# Patient Record
Sex: Female | Born: 1955 | Race: Black or African American | Hispanic: No | Marital: Single | Smoking: Never smoker
Health system: Southern US, Community
[De-identification: ages and names within clinical notes are randomized; demographics above are authoritative.]

## PROBLEM LIST (undated history)

## (undated) DIAGNOSIS — N390 Urinary tract infection, site not specified: Secondary | ICD-10-CM

## (undated) HISTORY — PX: TONSILLECTOMY: SUR1361

## (undated) HISTORY — PX: ABDOMINAL HYSTERECTOMY: SHX81

---

## 2000-01-16 ENCOUNTER — Encounter: Payer: Self-pay | Admitting: Emergency Medicine

## 2000-01-16 ENCOUNTER — Emergency Department (HOSPITAL_COMMUNITY): Admission: EM | Admit: 2000-01-16 | Discharge: 2000-01-16 | Payer: Self-pay | Admitting: Emergency Medicine

## 2000-09-03 ENCOUNTER — Ambulatory Visit (HOSPITAL_COMMUNITY): Admission: RE | Admit: 2000-09-03 | Discharge: 2000-09-03 | Payer: Self-pay | Admitting: Family Medicine

## 2000-09-03 ENCOUNTER — Encounter: Payer: Self-pay | Admitting: Family Medicine

## 2000-10-17 ENCOUNTER — Encounter: Admission: RE | Admit: 2000-10-17 | Discharge: 2000-12-03 | Payer: Self-pay | Admitting: Family Medicine

## 2001-08-13 ENCOUNTER — Emergency Department (HOSPITAL_COMMUNITY): Admission: EM | Admit: 2001-08-13 | Discharge: 2001-08-13 | Payer: Self-pay | Admitting: Emergency Medicine

## 2002-02-26 ENCOUNTER — Emergency Department (HOSPITAL_COMMUNITY): Admission: EM | Admit: 2002-02-26 | Discharge: 2002-02-27 | Payer: Self-pay | Admitting: Emergency Medicine

## 2004-10-22 ENCOUNTER — Emergency Department (HOSPITAL_COMMUNITY): Admission: EM | Admit: 2004-10-22 | Discharge: 2004-10-23 | Payer: Self-pay | Admitting: Emergency Medicine

## 2004-10-26 ENCOUNTER — Emergency Department (HOSPITAL_COMMUNITY): Admission: EM | Admit: 2004-10-26 | Discharge: 2004-10-26 | Payer: Self-pay | Admitting: Emergency Medicine

## 2005-08-26 ENCOUNTER — Inpatient Hospital Stay (HOSPITAL_COMMUNITY): Admission: EM | Admit: 2005-08-26 | Discharge: 2005-09-02 | Payer: Self-pay | Admitting: Emergency Medicine

## 2005-08-26 ENCOUNTER — Ambulatory Visit: Payer: Self-pay | Admitting: Infectious Diseases

## 2007-12-19 ENCOUNTER — Emergency Department (HOSPITAL_COMMUNITY): Admission: EM | Admit: 2007-12-19 | Discharge: 2007-12-19 | Payer: Self-pay | Admitting: Family Medicine

## 2008-05-12 ENCOUNTER — Emergency Department (HOSPITAL_COMMUNITY): Admission: EM | Admit: 2008-05-12 | Discharge: 2008-05-12 | Payer: Self-pay | Admitting: Emergency Medicine

## 2009-08-03 ENCOUNTER — Emergency Department (HOSPITAL_COMMUNITY): Admission: EM | Admit: 2009-08-03 | Discharge: 2009-08-03 | Payer: Self-pay | Admitting: Family Medicine

## 2010-09-22 ENCOUNTER — Ambulatory Visit (INDEPENDENT_AMBULATORY_CARE_PROVIDER_SITE_OTHER): Payer: Self-pay

## 2010-09-22 ENCOUNTER — Inpatient Hospital Stay (INDEPENDENT_AMBULATORY_CARE_PROVIDER_SITE_OTHER)
Admission: RE | Admit: 2010-09-22 | Discharge: 2010-09-22 | Disposition: A | Discharge status: Home / Self Care | Payer: Self-pay | Source: Ambulatory Visit | Attending: Family Medicine | Admitting: Family Medicine

## 2010-09-22 ENCOUNTER — Encounter (HOSPITAL_COMMUNITY): Payer: Self-pay

## 2010-09-22 DIAGNOSIS — R1012 Left upper quadrant pain: Secondary | ICD-10-CM

## 2010-09-22 LAB — POCT URINALYSIS DIP (DEVICE)
Bilirubin Urine: NEGATIVE
Glucose, UA: NEGATIVE mg/dL
Hgb urine dipstick: NEGATIVE
Ketones, ur: NEGATIVE mg/dL
Nitrite: NEGATIVE
Protein, ur: NEGATIVE mg/dL
Specific Gravity, Urine: 1.02 (ref 1.005–1.030)
Urobilinogen, UA: 0.2 mg/dL (ref 0.0–1.0)
pH: 5.5 (ref 5.0–8.0)

## 2010-10-20 NOTE — H&P (Signed)
NAME:  [name], [name]                  ACCOUNT NO.:  [id number]   MEDICAL RECORD NO.:  [id number]          PATIENT TYPE:  EMS   LOCATION:  [facility]                         FACILITY:  [facility]   PHYSICIAN:  Lonia Blood, M.D.DATE OF BIRTH:  [birth year]   DATE OF PROCEDURE:  DATE OF DISCHARGE:                      STAT - MUST CHANGE TO CORRECT WORK TYPE   PRIMARY CARE PHYSICIAN:  Della Goo, M.D.   CHIEF COMPLAINT:  Headache for four days with nausea and vomiting.   HISTORY OF PRESENT ILLNESS:  [name] is a very pleasant 55 year old  female who recently moved here from [place].  On August 16, 2005, she began  to experience cough, generalized body aches and lethargy.  She presented to  Dr. Lovell Sheehan for evaluation as a new patient.  She was diagnosed with  probable influenza and given a prescription for Tamiflu.  She as also given  a prescription for Z pack.  Patient completed her full course of Tamiflu,  waited a couple of days and then initiated her Z pack.  She finished her Z  pack approximately 1-1/2 to 2 weeks ago.  Initially she felt better with  increased energy though she did continue to cough.  Four days ago, she again  began to experience chills.  This time, she developed a generalized full  head headache.  She attempted to treat this with Aleve without success.  Headache continued throughout Friday and Saturday.  By Saturday night, she  was feeling very tired.  She noticed that loud noises really bothered her.  Walking caused a sense of a jackhammer in my head.  Bright lights began to  hurt her significantly and she felt that her neck was very sore.  Early  Sunday morning, she presented to the emergency room for evaluation.   In the emergency room, LP has been accomplished which reveals decreased  glucose and elevated protein with elevated white count and lymphocyte  predominance.  Patient is resting comfortably.  She is alert and oriented  and able to answer all  questions.   MEDICATIONS:  No chronic prescription medication.   ALLERGIES:  PENICILLIN.   REVIEW OF SYSTEMS:  COMPREHENSIVE:  Negative.  Patient does report that she  is currently sexually active.   PAST MEDICAL HISTORY:  1.  Status post hysterectomy approximately 15 years ago.  2.  Tobacco abuse in the amount of less than a pack per day 16 years with an      episode of discontinuing for eight years, then restarting.   FAMILY HISTORY:  Patient's mother is alive and has hypertension.  Patient's  father is alive with diabetes and hypertension.  Patient has siblings who  are all healthy.   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  She occasionally partakes of alcohol.  She  has recently moved here from [place].   LABORATORY DATA:  Hemoglobin is normal.  White count and platelet count are  normal.  MCV is 92.  Electrolytes are balanced and normal.  BUN is 13,  creatinine  0.8.  Serum glucose is mildly elevated at 103.  Urinalysis  negative.  Monospot is negative.   CT scan of the head reveals no acute disease.   LP reveals 63 red blood cells, 600 white blood cells and 85% lymphocyte  predominance, glucose is low normal at 43, protein is elevated at 150.  Gram  stain shows no organisms at this time.   PHYSICAL EXAMINATION:  GENERAL APPEARANCE:  Well-developed, well-nourished  female in no acute respiratory distress.  VITAL SIGNS:  Temperature 108, blood pressure 102/67, heart rate 77,  respiratory rate 96% on room air.  HEENT:  Patient is wearing colored contacts.  Pupils equal and reactive.  Patient does display some photophobia.  NECK:  There is meningismus.  LUNGS:  Clear to auscultation bilaterally.  CARDIOVASCULAR:  Regular rate and rhythm.  ABDOMEN:  Benign.  EXTREMITIES:  No significant clubbing, cyanosis, or edema.  NEUROLOGIC:  Patient is alert and oriented x4.  There is no evidence of a  delirium.  She  has full control of all four extremities and preserved  strength.   IMPRESSION:  1.  Meningitis - Patient has a meningitis.  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  The patient's low glucose is      somewhat concerning to me, however.  Especially in the setting of a      recent antibiotic course which could cloud the picture with a bacterial      meningitis.  I would, however, at this time that the early lymphocyte      predominance that can even be seen with bacterial meningitis, would have      converted to a polymorphonuclear predominance at this point.  I will,      however, choose to cover the patient empirically with Rocephin while      cultures are allowed to mature.  As the patient is sexually active, I      will also check HSV PCR, the patient's CSF.  CSF VDRL as well as serum      RPR will also be obtained.  Human immunodeficiency virus testing will be      carried out.  2.  Tobacco abuse - Patient is being counseled on the need to discontinue      tobacco abuse and advise of the multiple deleterious effects of ongoing      tobacco abuse.  Tobacco cessation consultation will be requested during      this hospital stay to encourage the patient in this regard.      Lonia Blood, M.D.  Electronically Signed     JTM/MEDQ  D:  08/26/2005  T:  08/27/2005  Job:  [id number]   cc:   Della Goo, M.D.  Fax: [phone]

## 2010-10-20 NOTE — Discharge Summary (Signed)
Toni Rodriguez, Toni Rodriguez                  ACCOUNT NO.:  [id number]   MEDICAL RECORD NO.:  [id number]          PATIENT TYPE:  INP   LOCATION:  5740                         FACILITY:  [facility]   PHYSICIAN:  Hillery Aldo, M.D.   DATE OF BIRTH:  Aug 26, 1955   DATE OF ADMISSION:  08/26/2005  DATE OF DISCHARGE:  09/02/2005                                 DISCHARGE SUMMARY   PRIMARY CARE PHYSICIAN:  Della Goo, M.D.   DISCHARGE DIAGNOSES:  1.  Herpes meningitis.  2.  Headache secondary to #1.  3.  Nausea and vomiting secondary to #1.  4.  Tobacco abuse.   DISCHARGE MEDICATIONS:  1.  Valtrex 1 g t.i.d. x2 weeks.  2.  Percocet 5/325 mg one to two tablets q.6h. p.r.n. pain.  3.  Phenergan 25 mg q.6h. p.r.n. nausea.   CONSULTANTS:  Lacretia Leigh. Ninetta Lights, M.D., infectious disease.   BRIEF ADMISSION HISTORY OF PRESENT ILLNESS:  The patient is a 55 year old  female with no significant past medical history, who presented to Dr.  Lovell Sheehan with generalized body aches, lethargy and cough.  She was thought to  have the flu and given a prescription for Tamiflu along with a Z-Pak.  She  completed a full course of Tamiflu and her Z-Pak and initially felt better  but then developed chills.  This was accompanied by a global headache which  did not respond to Aleve.  She therefore presented to the emergency  department with persistent severe headache, photophobia and phonophobia.  An  LP was done in the emergency department which showed evidence of viral  meningitis, and therefore she was admitted for further evaluation and  workup.   PROCEDURES AND DIAGNOSTIC STUDIES:  1.  CT scan of the head on August 26, 2005 was negative.  2.  Lumbar puncture on August 26, 2005.   DISCHARGE LABORATORY VALUES:  White blood cell count was 3.6, hemoglobin  10.6, hematocrit 31 and platelets 148.  PCR of the spinal fluid was positive  for herpes simplex.  Cultures remained negative.  Sodium was 138, potassium  3.8,  chloride 108, bicarb 26, BUN 5, creatinine 0.8, glucose 114.   HOSPITAL COURSE BY PROBLEM:  Problem 1.  HERPES MENINGITIS:  The patient was admitted with a presumptive  diagnosis of viral meningitis.  A lumbar puncture was performed and cultures  and PCR for herpes simplex virus were sent off.  She was empirically treated  with Rocephin and acyclovir pending culture results.  When the cultures  remained negative for three days, the Rocephin was continued.  The herpes  simplex virus PCR was positive and she was continued on acyclovir for one  full week of IV treatment.  Infectious disease consultation was requested  and kindly provided by Dr. Ninetta Lights, who suggested two weeks of p.o. therapy  with Valtrex.  He also suggested we check her urine for GC and chlamydia  prior to discharge.   She was stable for discharge by September 02, 2005, and was discharged with a  follow-up Dr. Barbette Merino.  Dr. Lovell Sheehan will need to follow up on  her GC and  chlamydia testing, as this was done on the day of discharge.   Problem 2.  HEADACHE/NAUSEA AND VOMITING:  These were side effects of her  active meningitis.  He was treated with IV antiemetics and narcotic pain  medications to control her pain and nausea.  At the time of this dictation,  she is able to hold on p.o.'s we will switch her to by-mouth antibiotics and  narcotic pain medications.   Problem 3.  TOBACCO USE:  The patient was counseled on smoking cessation.   DISPOSITION:  The patient is discharged home.  She is to follow up with Dr.  Lovell Sheehan in one to two weeks' time.  No restrictions on her activities or  diet.  She should call her primary care physician for any nonresolution of  symptoms.           ______________________________  Hillery Aldo, M.D.     CR/MEDQ  D:  09/02/2005  T:  09/03/2005  Job:  [id number]   cc:   Della Goo, M.D.  Fax: [phone]

## 2011-08-02 ENCOUNTER — Emergency Department (HOSPITAL_COMMUNITY)
Admission: EM | Admit: 2011-08-02 | Discharge: 2011-08-02 | Disposition: A | Discharge status: Home / Self Care | Payer: Self-pay | Source: Home / Self Care | Attending: Family Medicine | Admitting: Family Medicine

## 2011-08-02 ENCOUNTER — Encounter (HOSPITAL_COMMUNITY): Payer: Self-pay | Admitting: Emergency Medicine

## 2011-08-02 DIAGNOSIS — M545 Low back pain, unspecified: Secondary | ICD-10-CM

## 2011-08-02 HISTORY — DX: Urinary tract infection, site not specified: N39.0

## 2011-08-02 LAB — POCT URINALYSIS DIP (DEVICE)
Leukocytes, UA: NEGATIVE
Nitrite: NEGATIVE
Protein, ur: NEGATIVE mg/dL
Urobilinogen, UA: 0.2 mg/dL (ref 0.0–1.0)
pH: 5.5 (ref 5.0–8.0)

## 2011-08-02 LAB — POCT PREGNANCY, URINE: Preg Test, Ur: NEGATIVE

## 2011-08-02 MED ORDER — IBUPROFEN 800 MG PO TABS: 800.0000 mg | ORAL_TABLET | Freq: Three times a day (TID) | ORAL | Status: AC

## 2011-08-02 MED ORDER — CYCLOBENZAPRINE HCL 10 MG PO TABS: 10.0000 mg | ORAL_TABLET | Freq: Three times a day (TID) | ORAL | Status: AC | PRN

## 2011-08-02 NOTE — ED Notes (Signed)
Patient reports urinary urgency and frequency, denies any burning with urination.  Patient used test from store that indicated a uti

## 2011-08-02 NOTE — ED Provider Notes (Signed)
History     CSN: [id number]  Arrival date & time 08/02/11  1758   First MD Initiated Contact with Patient 08/02/11 1808      Chief Complaint  Patient presents with  . Back Pain    (Consider location/radiation/quality/duration/timing/severity/associated sxs/prior treatment) HPI Comments: The patient reports having chronic intermittent low back pain. For the past wk she has had pain and urinary frequency. She is unsure if it is a uti or her back pain. No fever. Pain more on left. occ sharpe pain. Has frequency and urgency. No fever. No known injury. No change in bladder or bowel control  The history is provided by the patient.    Past Medical History  Diagnosis Date  . UTI (lower urinary tract infection)     Past Surgical History  Procedure Date  . Abdominal hysterectomy   . Tonsillectomy     History reviewed. No pertinent family history.  History  Substance Use Topics  . Smoking status: Never Smoker   . Smokeless tobacco: Not on file  . Alcohol Use: No    OB History    Grav Para Term Preterm Abortions TAB SAB Ect Mult Living                  Review of Systems  Constitutional: Negative.   HENT: Negative.   Respiratory: Negative.   Cardiovascular: Negative.   Gastrointestinal: Negative.     Allergies  Penicillins  Home Medications   Current Outpatient Rx  Name Route Sig Dispense Refill  . OVER THE COUNTER MEDICATION  Azo --took this medicine 3 days ago, took this med for 2 days at that time    . CYCLOBENZAPRINE HCL 10 MG PO TABS Oral Take 1 tablet (10 mg total) by mouth 3 (three) times daily as needed for muscle spasms. 15 tablet 0  . IBUPROFEN 800 MG PO TABS Oral Take 1 tablet (800 mg total) by mouth 3 (three) times daily. 21 tablet 0    BP 114/67  Pulse 79  Temp(Src) 98.7 F (37.1 C) (Oral)  Resp 18  SpO2 97%  Physical Exam  Nursing note and vitals reviewed. Constitutional: She appears well-developed and well-nourished. No distress.    Cardiovascular: Normal rate.   Pulmonary/Chest: Effort normal and breath sounds normal.  Abdominal: Soft. Bowel sounds are normal. There is no tenderness. There is no rebound and no guarding.  Musculoskeletal:       Pain left flank. Pain with flex and extension. Heel toe walk intact. Neg slr. , dtr symmetrical. No deformity. No skin changes    ED Course  Procedures (including critical care time)   Labs Reviewed  POCT URINALYSIS DIP (DEVICE)  POCT PREGNANCY, URINE   No results found.   1. Low back pain       MDM          Randa Spike, MD 08/02/11 2001

## 2011-08-02 NOTE — ED Notes (Signed)
Lower back and side pain.  Patient concerned for uti.  Onset one week ago of symptoms.

## 2012-09-16 ENCOUNTER — Emergency Department (INDEPENDENT_AMBULATORY_CARE_PROVIDER_SITE_OTHER)
Admission: EM | Admit: 2012-09-16 | Discharge: 2012-09-16 | Disposition: A | Discharge status: Home / Self Care | Payer: Self-pay | Source: Home / Self Care | Attending: Emergency Medicine | Admitting: Emergency Medicine

## 2012-09-16 ENCOUNTER — Encounter (HOSPITAL_COMMUNITY): Payer: Self-pay | Admitting: Emergency Medicine

## 2012-09-16 DIAGNOSIS — L509 Urticaria, unspecified: Secondary | ICD-10-CM

## 2012-09-16 DIAGNOSIS — N318 Other neuromuscular dysfunction of bladder: Secondary | ICD-10-CM

## 2012-09-16 DIAGNOSIS — N3281 Overactive bladder: Secondary | ICD-10-CM

## 2012-09-16 LAB — POCT URINALYSIS DIP (DEVICE)
Ketones, ur: NEGATIVE mg/dL
Leukocytes, UA: NEGATIVE
Protein, ur: NEGATIVE mg/dL
Urobilinogen, UA: 0.2 mg/dL (ref 0.0–1.0)
pH: 5.5 (ref 5.0–8.0)

## 2012-09-16 MED ORDER — HYDROXYZINE HCL 25 MG PO TABS: 25.0000 mg | ORAL_TABLET | Freq: Four times a day (QID) | ORAL | Status: DC

## 2012-09-16 MED ORDER — PREDNISONE 10 MG PO TABS: 40.0000 mg | ORAL_TABLET | Freq: Every day | ORAL | Status: AC

## 2012-09-16 NOTE — ED Provider Notes (Signed)
History     CSN: [id number]  Arrival date & time 09/16/12  4098   First MD Initiated Contact with Patient 09/16/12 1839      Chief Complaint  Patient presents with  . Urinary Tract Infection    hx of leaking bladder. urgency  . Rash    on arms and legs.. ?'s allergic reaction to crab meat or oxytrol patch    (Consider location/radiation/quality/duration/timing/severity/associated sxs/prior treatment) HPI Comments: Patient presents urgent care tonight describes a for the last couple weeks she suspects she's been having an allergic reaction to either some crab meat she ate or to the Oxytrol patch. She started developing this areas of redness and itchiness on her upper extremities near her neck area and on her lower extremities. They have " come and gone", and they have not responded to some of the medicines she was taking for allergies over-the-counter. Patient denies any facial swelling difficulty swallowing or problems breathing. She opted to discontinue her Oxytrol as she was expecting suspected that this might have been related to this medicine. It's been several days since she stopped, and describes.. she is again having increased urinary urgency and have had a couple of accidents. Denies any burning or pain with urination flank pain fevers or pelvic pain.  Patient is a 57 y.o. female presenting with urinary tract infection and rash. The history is provided by the patient.  Urinary Tract Infection This is a recurrent problem. The current episode started more than 1 week ago. The problem occurs constantly. The problem has not changed since onset.Pertinent negatives include no abdominal pain, no headaches and no shortness of breath. Nothing aggravates the symptoms. Nothing relieves the symptoms. She has tried nothing (Over-the-counter allergy medicines) for the symptoms.  Rash Associated symptoms: no abdominal pain, no fatigue, no fever, no headaches, no nausea and no shortness of breath      Past Medical History  Diagnosis Date  . UTI (lower urinary tract infection)     Past Surgical History  Procedure Laterality Date  . Abdominal hysterectomy    . Tonsillectomy      History reviewed. No pertinent family history.  History  Substance Use Topics  . Smoking status: Never Smoker   . Smokeless tobacco: Not on file  . Alcohol Use: No    OB History   Grav Para Term Preterm Abortions TAB SAB Ect Mult Living                  Review of Systems  Constitutional: Negative for fever, chills, diaphoresis, activity change, appetite change and fatigue.  Respiratory: Negative for shortness of breath.   Gastrointestinal: Negative for nausea, abdominal pain and abdominal distention.  Genitourinary: Positive for urgency and frequency. Negative for flank pain, decreased urine volume, vaginal bleeding, genital sores and dyspareunia.  Skin: Positive for rash. Negative for color change and wound.  Neurological: Negative for headaches.    Allergies  Penicillins  Home Medications   Current Outpatient Rx  Name  Route  Sig  Dispense  Refill  . hydrOXYzine (ATARAX/VISTARIL) 25 MG tablet   Oral   Take 1 tablet (25 mg total) by mouth every 6 (six) hours.   12 tablet   0   . OVER THE COUNTER MEDICATION      Azo --took this medicine 3 days ago, took this med for 2 days at that time         . predniSONE (DELTASONE) 10 MG tablet   Oral   Take  4 tablets (40 mg total) by mouth daily.   15 tablet   0     There were no vitals taken for this visit.  Physical Exam  Vitals reviewed. Constitutional: Vital signs are normal. She appears well-developed and well-nourished.  Non-toxic appearance. She does not have a sickly appearance. She does not appear ill. No distress.  Eyes: Conjunctivae are normal. Pupils are equal, round, and reactive to light. Right eye exhibits no discharge. Left eye exhibits no discharge. No scleral icterus.  Abdominal: Soft.  Neurological: She is  alert.  Skin: Rash noted. No purpura noted. Rash is macular and urticarial. Rash is not nodular and not vesicular.       ED Course  Procedures (including critical care time)  Labs Reviewed  URINALYSIS, DIPSTICK ONLY  POCT URINALYSIS DIP (DEVICE)   No results found.   1. Urticaria   2. Overactive bladder       MDM  Problem #1 or Urticaria-hives, most likely allergenic in origin difficult to discern if this is truly a reaction to Oxytrol although cannot rule out a delayed hypersensitivity reaction. Will have patient take a cycle of prednisone of 7 days along with hydroxyzine.  Problem #2 over active bladder, have encouraged and instructed patient to followup and discuss this further with her primary care doctor. As she already stopped this medicine to discuss further alternatives or options. Her urine test tonight was unremarkable and not consistent with infection.     Jimmie Molly, MD 09/16/12 [phone]

## 2012-09-16 NOTE — ED Notes (Signed)
Urinary urgency and leaking bladder. Pt has been using oxytrol which was helping but thinks she is having an allergic reaction to it or crab meat that she has eaten.   Pt has a rash on arms and legs. otc allergy meds not working.

## 2014-05-12 ENCOUNTER — Encounter (HOSPITAL_COMMUNITY): Payer: Self-pay | Admitting: Emergency Medicine

## 2014-05-12 ENCOUNTER — Emergency Department (HOSPITAL_COMMUNITY)
Admission: EM | Admit: 2014-05-12 | Discharge: 2014-05-12 | Disposition: A | Discharge status: Home / Self Care | Payer: Self-pay | Source: Home / Self Care | Attending: Emergency Medicine | Admitting: Emergency Medicine

## 2014-05-12 DIAGNOSIS — J189 Pneumonia, unspecified organism: Secondary | ICD-10-CM

## 2014-05-12 MED ORDER — ALBUTEROL SULFATE HFA 108 (90 BASE) MCG/ACT IN AERS: 1.0000 | INHALATION_SPRAY | Freq: Four times a day (QID) | RESPIRATORY_TRACT | Status: DC | PRN

## 2014-05-12 MED ORDER — IPRATROPIUM-ALBUTEROL 0.5-2.5 (3) MG/3ML IN SOLN
3.0000 mL | Freq: Once | RESPIRATORY_TRACT | Status: AC
  Administered 2014-05-12: 3 mL via RESPIRATORY_TRACT

## 2014-05-12 MED ORDER — AZITHROMYCIN 250 MG PO TABS: 250.0000 mg | ORAL_TABLET | Freq: Every day | ORAL | Status: DC

## 2014-05-12 MED ORDER — IPRATROPIUM-ALBUTEROL 0.5-2.5 (3) MG/3ML IN SOLN
RESPIRATORY_TRACT | Status: AC
  Filled 2014-05-12: qty 3

## 2014-05-12 NOTE — ED Notes (Signed)
Clicked the Rapid Strep by accident, called [facility] and informed Toni Rodriguez that the specimen had not been obtained, therefore there would be no results on the strep test.

## 2014-05-12 NOTE — ED Provider Notes (Signed)
CSN: [id number]     Arrival date & time 05/12/14  0903 History   First MD Initiated Contact with Patient 05/12/14 0920     Chief Complaint  Patient presents with  . URI   (Consider location/radiation/quality/duration/timing/severity/associated sxs/prior Treatment) Patient is a 59 y.o. female presenting with URI. The history is provided by the patient.  URI Presenting symptoms: congestion, cough, fatigue, fever, rhinorrhea and sore throat   Severity:  Moderate Onset quality:  Gradual Duration:  6 days Timing:  Constant Progression:  Worsening Chronicity:  New Associated symptoms: wheezing   Risk factors: sick contacts     Past Medical History  Diagnosis Date  . UTI (lower urinary tract infection)    Past Surgical History  Procedure Laterality Date  . Abdominal hysterectomy    . Tonsillectomy     No family history on file. History  Substance Use Topics  . Smoking status: Never Smoker   . Smokeless tobacco: Not on file  . Alcohol Use: No   OB History    No data available     Review of Systems  Constitutional: Positive for fever and fatigue.  HENT: Positive for congestion, rhinorrhea and sore throat.   Eyes: Positive for discharge and redness. Negative for visual disturbance.  Respiratory: Positive for cough, chest tightness and wheezing.   Cardiovascular: Negative.   Gastrointestinal: Negative.   Musculoskeletal: Negative.   Skin: Negative.     Allergies  Penicillins  Home Medications   Prior to Admission medications   Medication Sig Start Date End Date Taking? Authorizing Provider  albuterol (PROVENTIL HFA;VENTOLIN HFA) 108 (90 BASE) MCG/ACT inhaler Inhale 1-2 puffs into the lungs every 6 (six) hours as needed for wheezing or shortness of breath. Or persistent coughing 05/12/14   Audelia Hives Shoshanna Mcquitty, PA  azithromycin (ZITHROMAX) 250 MG tablet Take 1 tablet (250 mg total) by mouth daily. Take first 2 tablets together, then 1 every day until finished. 05/12/14    Audelia Hives Obinna Ehresman, PA  hydrOXYzine (ATARAX/VISTARIL) 25 MG tablet Take 1 tablet (25 mg total) by mouth every 6 (six) hours. 09/16/12   Rosana Hoes, MD  OVER THE COUNTER MEDICATION Azo --took this medicine 3 days ago, took this med for 2 days at that time    Historical Provider, MD   BP 126/76 mmHg  Pulse 105  Temp(Src) 99.7 F (37.6 C) (Oral)  Resp 16  SpO2 95% Physical Exam  Constitutional: She is oriented to person, place, and time. She appears well-developed and well-nourished. No distress.  HENT:  Head: Normocephalic and atraumatic.  Right Ear: Hearing, tympanic membrane, external ear and ear canal normal.  Left Ear: Hearing, tympanic membrane, external ear and ear canal normal.  Nose: Mucosal edema and rhinorrhea present.  Mouth/Throat: Uvula is midline, oropharynx is clear and moist and mucous membranes are normal.  Eyes: EOM are normal. Pupils are equal, round, and reactive to light. Right eye exhibits discharge. Left eye exhibits discharge. Right conjunctiva is injected. Left conjunctiva is injected. No scleral icterus.  Neck: Normal range of motion. Neck supple.  Cardiovascular: Normal rate, regular rhythm and normal heart sounds.   Pulmonary/Chest: Effort normal. No accessory muscle usage. No respiratory distress. She has wheezes. She has rhonchi in the right middle field and the left middle field.  Musculoskeletal: Normal range of motion.  Lymphadenopathy:    She has no cervical adenopathy.  Neurological: She is alert and oriented to person, place, and time.  Skin: Skin is warm and dry.  Psychiatric: She has a normal mood and affect. Her behavior is normal.  Nursing note and vitals reviewed.   ED Course  Procedures (including critical care time) Labs Review Labs Reviewed - No data to display  Imaging Review No results found.   MDM   1. CAP (community acquired pneumonia)   While patient stable, vital signs with mild tachycardia and 02 sat of 95% in non-smoker  with slightly elevated temp. Given above in combination with rhonchi and wheezing on exam, I am concerned for the development of CAP and will initiate treatment with 5 day course of Azithromycin and albuterol MDI as needed for wheezing. Advised close follow up with PCP if symptoms do not improve.     Lutricia Feil, [place] 05/12/14 1032

## 2014-05-12 NOTE — ED Notes (Signed)
C/o cold sx onset 4-5 days Sx include: ST, cough, wheezing, chest d/c Denies f/v/d Alert, no signs of acute distress.

## 2014-05-12 NOTE — Discharge Instructions (Signed)

## 2014-06-06 ENCOUNTER — Emergency Department (HOSPITAL_COMMUNITY)
Admission: EM | Admit: 2014-06-06 | Discharge: 2014-06-06 | Disposition: A | Discharge status: Home / Self Care | Payer: Self-pay | Attending: Emergency Medicine | Admitting: Emergency Medicine

## 2014-06-06 ENCOUNTER — Emergency Department (HOSPITAL_COMMUNITY): Payer: Self-pay

## 2014-06-06 ENCOUNTER — Encounter (HOSPITAL_COMMUNITY): Payer: Self-pay | Admitting: *Deleted

## 2014-06-06 DIAGNOSIS — R000 Tachycardia, unspecified: Secondary | ICD-10-CM | POA: Insufficient documentation

## 2014-06-06 DIAGNOSIS — Z792 Long term (current) use of antibiotics: Secondary | ICD-10-CM | POA: Insufficient documentation

## 2014-06-06 DIAGNOSIS — Z880 Allergy status to penicillin: Secondary | ICD-10-CM | POA: Insufficient documentation

## 2014-06-06 DIAGNOSIS — R0789 Other chest pain: Secondary | ICD-10-CM | POA: Insufficient documentation

## 2014-06-06 DIAGNOSIS — Z8701 Personal history of pneumonia (recurrent): Secondary | ICD-10-CM | POA: Insufficient documentation

## 2014-06-06 DIAGNOSIS — R0602 Shortness of breath: Secondary | ICD-10-CM

## 2014-06-06 DIAGNOSIS — J40 Bronchitis, not specified as acute or chronic: Secondary | ICD-10-CM

## 2014-06-06 DIAGNOSIS — Z87440 Personal history of urinary (tract) infections: Secondary | ICD-10-CM | POA: Insufficient documentation

## 2014-06-06 DIAGNOSIS — J209 Acute bronchitis, unspecified: Secondary | ICD-10-CM | POA: Insufficient documentation

## 2014-06-06 DIAGNOSIS — Z79899 Other long term (current) drug therapy: Secondary | ICD-10-CM | POA: Insufficient documentation

## 2014-06-06 DIAGNOSIS — R110 Nausea: Secondary | ICD-10-CM | POA: Insufficient documentation

## 2014-06-06 LAB — BASIC METABOLIC PANEL
Anion gap: 7 (ref 5–15)
BUN: 8 mg/dL (ref 6–23)
CALCIUM: 9.5 mg/dL (ref 8.4–10.5)
CO2: 26 mmol/L (ref 19–32)
Chloride: 104 mEq/L (ref 96–112)
Creatinine, Ser: 0.84 mg/dL (ref 0.50–1.10)
GFR calc Af Amer: 88 mL/min — ABNORMAL LOW (ref >90)
GFR calc non Af Amer: 76 mL/min — ABNORMAL LOW (ref >90)
GLUCOSE: 102 mg/dL — AB (ref 70–99)
Potassium: 3.5 mmol/L (ref 3.5–5.1)
SODIUM: 137 mmol/L (ref 135–145)

## 2014-06-06 LAB — CBC
HCT: 36.7 % (ref 36.0–46.0)
HEMOGLOBIN: 12.2 g/dL (ref 12.0–15.0)
MCH: 28.6 pg (ref 26.0–34.0)
MCHC: 33.2 g/dL (ref 30.0–36.0)
MCV: 85.9 fL (ref 78.0–100.0)
Platelets: 160 10*3/uL (ref 150–400)
RBC: 4.27 MIL/uL (ref 3.87–5.11)
RDW: 13.9 % (ref 11.5–15.5)
WBC: 4.1 10*3/uL (ref 4.0–10.5)

## 2014-06-06 LAB — I-STAT TROPONIN, ED: Troponin i, poc: 0 ng/mL (ref 0.00–0.08)

## 2014-06-06 LAB — BRAIN NATRIURETIC PEPTIDE: B NATRIURETIC PEPTIDE 5: 21.8 pg/mL (ref 0.0–100.0)

## 2014-06-06 MED ORDER — AZITHROMYCIN 250 MG PO TABS: 500.0000 mg | ORAL_TABLET | Freq: Every day | ORAL | Status: AC

## 2014-06-06 MED ORDER — AZITHROMYCIN 250 MG PO TABS
500.0000 mg | ORAL_TABLET | Freq: Once | ORAL | Status: AC
  Administered 2014-06-06: 500 mg via ORAL
  Filled 2014-06-06: qty 2

## 2014-06-06 MED ORDER — PREDNISONE 20 MG PO TABS
60.0000 mg | ORAL_TABLET | ORAL | Status: AC
  Administered 2014-06-06: 60 mg via ORAL
  Filled 2014-06-06: qty 3

## 2014-06-06 MED ORDER — PREDNISONE 20 MG PO TABS: 60.0000 mg | ORAL_TABLET | Freq: Every day | ORAL | Status: AC

## 2014-06-06 NOTE — ED Notes (Addendum)
Pt reports being seen [facility] week for cough and sob, was diagnosed with pneumonia and took 5 days of antibiotics as prescribed. Pt feels like her symptoms are returning, having productive cough and sob. Reports having fluid drawn from her lungs in past, this feels similar. Airway intact, ekg done.

## 2014-06-06 NOTE — Discharge Instructions (Signed)
As discussed, your evaluation today has been largely reassuring.  But, it is important that you monitor your condition carefully, and do not hesitate to return to the ED if you develop new, or concerning changes in your condition. ? ?Otherwise, please follow-up with your physician for appropriate ongoing care. ? ?

## 2014-06-06 NOTE — ED Provider Notes (Signed)
CSN: [id number]     Arrival date & time 06/06/14  1517 History   First MD Initiated Contact with Patient 06/06/14 1742     Chief Complaint  Patient presents with  . Shortness of Breath  . Cough     HPI Patient presents with concern of ongoing cough, mild dyspnea. Patient completed in about for a recent episode of pneumonia, 2 days ago. She notes that since completion of her regimen, she continues to have cough, chest tightness associated with coughing, but not at rest. There is no new significant, near syncope. There is new nausea, but no vomiting. No recent fever, chills. No weight change, lower extremity swelling.    Past Medical History  Diagnosis Date  . UTI (lower urinary tract infection)   . Pneumonia    Past Surgical History  Procedure Laterality Date  . Abdominal hysterectomy    . Tonsillectomy     No family history on file. History  Substance Use Topics  . Smoking status: Never Smoker   . Smokeless tobacco: Not on file  . Alcohol Use: No   OB History    No data available     Review of Systems  Constitutional:       Per HPI, otherwise negative  HENT:       Per HPI, otherwise negative  Respiratory:       Per HPI, otherwise negative  Cardiovascular:       Per HPI, otherwise negative  Gastrointestinal: Negative for vomiting.  Endocrine:       Negative aside from HPI  Genitourinary:       Neg aside from HPI   Musculoskeletal:       Per HPI, otherwise negative  Skin: Negative.   Neurological: Negative for syncope.      Allergies  Penicillins  Home Medications   Prior to Admission medications   Medication Sig Start Date End Date Taking? Authorizing Provider  albuterol (PROVENTIL HFA;VENTOLIN HFA) 108 (90 BASE) MCG/ACT inhaler Inhale 1-2 puffs into the lungs every 6 (six) hours as needed for wheezing or shortness of breath. Or persistent coughing 05/12/14   Audelia Hives Presson, PA  azithromycin (ZITHROMAX) 250 MG tablet Take 1 tablet (250 mg  total) by mouth daily. Take first 2 tablets together, then 1 every day until finished. 05/12/14   Audelia Hives Presson, PA  hydrOXYzine (ATARAX/VISTARIL) 25 MG tablet Take 1 tablet (25 mg total) by mouth every 6 (six) hours. 09/16/12   Rosana Hoes, MD  OVER THE COUNTER MEDICATION Azo --took this medicine 3 days ago, took this med for 2 days at that time    Historical Provider, MD   BP 124/67 mmHg  Pulse 107  Temp(Src) 99.3 F (37.4 C) (Oral)  Resp 17  SpO2 95% Physical Exam  Constitutional: She is oriented to person, place, and time. She appears well-developed and well-nourished. No distress.  HENT:  Head: Normocephalic and atraumatic.  Eyes: Conjunctivae and EOM are normal.  Cardiovascular: Regular rhythm.  Tachycardia present.   Pulmonary/Chest: Effort normal and breath sounds normal. No stridor. No respiratory distress.  Abdominal: She exhibits no distension.  Musculoskeletal: She exhibits no edema.  Neurological: She is alert and oriented to person, place, and time. No cranial nerve deficit.  Skin: Skin is warm and dry.  Psychiatric: She has a normal mood and affect.  Nursing note and vitals reviewed.   ED Course  Procedures (including critical care time) Labs Review Labs Reviewed  CBC  BASIC METABOLIC  PANEL  BRAIN NATRIURETIC PEPTIDE  I-STAT TROPOININ, ED    Imaging Review Dg Chest 2 View  06/06/2014   CLINICAL DATA:  Productive cough, shortness of breath, chest heaviness and upper back pain which began 1 week ago.  EXAM: CHEST  2 VIEW  COMPARISON:  10/23/2004.  FINDINGS: Cardiac silhouette upper normal in size, unchanged. Thoracic aorta mildly tortuous and atherosclerotic, progressive since prior examination. Linear scar or atelectasis in the right lower lobe. Mildly prominent bronchovascular markings diffusely and mild to moderate central peribronchial thickening, more so than on the prior examination. Lungs otherwise clear. No localized airspace consolidation. No pleural  effusions. No pneumothorax. Normal pulmonary vascularity. Mild degenerative changes involving the thoracic spine.  IMPRESSION: Mild changes of bronchitis and/or asthma without focal airspace pneumonia. Linear atelectasis or scar in the right lower lobe.   Electronically Signed   By: Evangeline Dakin M.D.   On: 06/06/2014 16:32     EKG Interpretation   Date/Time:  Sunday June 06 2014 15:22:22 EST Ventricular Rate:  97 PR Interval:  116 QRS Duration: 78 QT Interval:  326 QTC Calculation: 414 R Axis:   63 Text Interpretation:  Normal sinus rhythm with sinus arrhythmia  Nonspecific T wave abnormality Abnormal ECG Sinus rhythm Artifact T wave  abnormality Abnormal ekg Confirmed by Carmin Muskrat  MD ([id number]) on  06/06/2014 6:43:52 PM     8:27 PM Patient appears substantially more comfortable. We had a lengthy conversation about her x-ray, her recent pneumonia, and her diagnosis of bronchitis. Patient voiced a preference for continued antibiotics. Given the patient's mild tachycardia, and her only recent completion of antibiotic, this was accommodated.  MDM   Final diagnoses:  SOB (shortness of breath)   Patient presents with ongoing cough, dyspnea following recent completion of antibiotics for pneumonia. Here the patient is not hypoxic, or tachypnea, but is mildly tachycardic, borderline febrile. Patient's evaluation suggests bronchitis. No suspicion for ongoing coronary ischemia given the absence of chest pain, or other anginal equivalents. Patient has no risk factors for PE. Patient was discharged stable condition.     Carmin Muskrat, MD 06/06/14 2028

## 2014-06-07 ENCOUNTER — Telehealth: Payer: Self-pay | Admitting: *Deleted

## 2014-06-07 NOTE — Telephone Encounter (Signed)
Spoke to pt regarding affording medications.  States she is between jobs.  Chart reviewed.  Explained the [place]- Medication Assistance program to pt. Pt understand that the [facility] program is only a one time in one year from the date of discharge to next year. Pt also understand that there is a $3.00 co pay for each prescription. Pt will come to ER to pick up [facility] letter.

## 2014-08-23 ENCOUNTER — Encounter (HOSPITAL_COMMUNITY): Payer: Self-pay | Admitting: Emergency Medicine

## 2014-08-23 ENCOUNTER — Emergency Department (INDEPENDENT_AMBULATORY_CARE_PROVIDER_SITE_OTHER)
Admission: EM | Admit: 2014-08-23 | Discharge: 2014-08-23 | Disposition: A | Discharge status: Home / Self Care | Payer: Self-pay | Source: Home / Self Care | Attending: Emergency Medicine | Admitting: Emergency Medicine

## 2014-08-23 DIAGNOSIS — B9789 Other viral agents as the cause of diseases classified elsewhere: Principal | ICD-10-CM

## 2014-08-23 DIAGNOSIS — N3941 Urge incontinence: Secondary | ICD-10-CM

## 2014-08-23 DIAGNOSIS — J069 Acute upper respiratory infection, unspecified: Secondary | ICD-10-CM

## 2014-08-23 MED ORDER — CETIRIZINE HCL 10 MG PO TABS: 10.0000 mg | ORAL_TABLET | Freq: Every day | ORAL | Status: DC

## 2014-08-23 MED ORDER — OXYBUTYNIN CHLORIDE 5 MG PO TABS: 5.0000 mg | ORAL_TABLET | Freq: Three times a day (TID) | ORAL | Status: AC

## 2014-08-23 MED ORDER — PREDNISONE 50 MG PO TABS: ORAL_TABLET | ORAL | Status: DC

## 2014-08-23 MED ORDER — IPRATROPIUM BROMIDE 0.06 % NA SOLN: 2.0000 | Freq: Four times a day (QID) | NASAL | Status: DC

## 2014-08-23 NOTE — ED Provider Notes (Signed)
CSN: [id number]     Arrival date & time 08/23/14  1919 History   First MD Initiated Contact with Patient 08/23/14 2044     Chief Complaint  Patient presents with  . Sore Throat  . Facial Pain  . Mouth Lesions  . Fever  . escessive urination    (Consider location/radiation/quality/duration/timing/severity/associated sxs/prior Treatment) HPI  She is a 59 year old woman here for evaluation of upper respiratory symptoms. Her symptoms started on Friday with nasal congestion, rhinorrhea, postnasal drip, sore throat, sinus pressure, fevers, mild cough. She also reports some mouth ulcers. No shortness of breath, nausea, vomiting. Positive sick contacts.  She also reports a several month history of urinary frequency with urge incontinence. She has been tested for diabetes in the past and this has been negative. She denies any dysuria. No abdominal pain.  Past Medical History  Diagnosis Date  . UTI (lower urinary tract infection)   . Pneumonia    Past Surgical History  Procedure Laterality Date  . Abdominal hysterectomy    . Tonsillectomy     History reviewed. No pertinent family history. History  Substance Use Topics  . Smoking status: Never Smoker   . Smokeless tobacco: Not on file  . Alcohol Use: No   OB History    No data available     Review of Systems  Constitutional: Positive for fever.  HENT: Positive for congestion, mouth sores, postnasal drip, rhinorrhea, sinus pressure and sore throat.   Respiratory: Positive for cough. Negative for shortness of breath.   Gastrointestinal: Negative for nausea, vomiting and abdominal pain.  Genitourinary: Positive for urgency and frequency. Negative for dysuria.    Allergies  Penicillins  Home Medications   Prior to Admission medications   Medication Sig Start Date End Date Taking? Authorizing Provider  albuterol (PROVENTIL HFA;VENTOLIN HFA) 108 (90 BASE) MCG/ACT inhaler Inhale 1-2 puffs into the lungs every 6 (six) hours as  needed for wheezing or shortness of breath. Or persistent coughing 05/12/14   Lutricia Feil, PA  cetirizine (ZYRTEC) 10 MG tablet Take 1 tablet (10 mg total) by mouth daily. 08/23/14   Melony Overly, MD  hydrOXYzine (ATARAX/VISTARIL) 25 MG tablet Take 1 tablet (25 mg total) by mouth every 6 (six) hours. 09/16/12   Rosana Hoes, MD  ipratropium (ATROVENT) 0.06 % nasal spray Place 2 sprays into both nostrils 4 (four) times daily. 08/23/14   Melony Overly, MD  OVER THE COUNTER MEDICATION Azo --took this medicine 3 days ago, took this med for 2 days at that time    Historical Provider, MD  oxybutynin (DITROPAN) 5 MG tablet Take 1 tablet (5 mg total) by mouth 3 (three) times daily. 08/23/14   Melony Overly, MD  predniSONE (DELTASONE) 50 MG tablet Take 1 pill daily for 5 days. 08/23/14   Melony Overly, MD   BP 116/70 mmHg  Pulse 72  Temp(Src) 98.1 F (36.7 C) (Oral)  Resp 16  SpO2 99% Physical Exam  Constitutional: She is oriented to person, place, and time. She appears well-developed and well-nourished. No distress.  HENT:  Head: Normocephalic and atraumatic.  Right Ear: External ear normal.  Left Ear: External ear normal.  Nose: Mucosal edema and rhinorrhea present.  Mouth/Throat: Mucous membranes are not dry. No oropharyngeal exudate or posterior oropharyngeal erythema.  She has a few small shallow ulcers on the buccal mucosa  Neck: Neck supple.  Cardiovascular: Normal rate, regular rhythm and normal heart sounds.   No murmur heard.  Pulmonary/Chest: Effort normal and breath sounds normal. No respiratory distress. She has no wheezes. She has no rales.  Lymphadenopathy:    She has no cervical adenopathy.  Neurological: She is alert and oriented to person, place, and time.    ED Course  Procedures (including critical care time) Labs Review Labs Reviewed - No data to display  Imaging Review No results found.   MDM   1. Viral URI with cough   2. Urge incontinence    We'll treat URI  with prednisone, Atrovent nasal spray, Zyrtec. We will try oxybutynin 5 mg 3 times a day for the urinary frequency and urgency. Follow-up as needed.    Melony Overly, MD 08/23/14 2111

## 2014-08-23 NOTE — ED Notes (Signed)
Pt has been suffering from a sore/swollen throat, sinus pain and congestion, low grade fever, mouth ulcers and excessive urination since Friday.

## 2014-08-23 NOTE — Discharge Instructions (Signed)
You have a virus causing your upper respiratory symptoms. Take prednisone for 5 days. Take Zyrtec daily for the next week. Atrovent nasal spray 4 times a day for the next week.  We are going to try oxybutynin for your urinary urgency. Take 1 pill 3 times a day for the next 2 weeks.  Follow-up as needed.

## 2014-11-03 DEATH — deceased

## 2015-09-20 ENCOUNTER — Other Ambulatory Visit (HOSPITAL_COMMUNITY): Payer: Self-pay | Admitting: *Deleted

## 2015-09-20 DIAGNOSIS — N644 Mastodynia: Secondary | ICD-10-CM

## 2015-10-03 HISTORY — PX: BREAST BIOPSY: SHX20

## 2015-10-06 ENCOUNTER — Ambulatory Visit
Admission: RE | Admit: 2015-10-06 | Discharge: 2015-10-06 | Disposition: A | Discharge status: Home / Self Care | Payer: No Typology Code available for payment source | Source: Ambulatory Visit | Attending: Obstetrics and Gynecology | Admitting: Obstetrics and Gynecology

## 2015-10-06 ENCOUNTER — Encounter (HOSPITAL_COMMUNITY): Payer: Self-pay

## 2015-10-06 ENCOUNTER — Other Ambulatory Visit (HOSPITAL_COMMUNITY): Payer: Self-pay | Admitting: Obstetrics and Gynecology

## 2015-10-06 ENCOUNTER — Ambulatory Visit (HOSPITAL_COMMUNITY)
Admission: RE | Admit: 2015-10-06 | Discharge: 2015-10-06 | Disposition: A | Discharge status: Home / Self Care | Payer: Self-pay | Source: Ambulatory Visit | Attending: Obstetrics and Gynecology | Admitting: Obstetrics and Gynecology

## 2015-10-06 VITALS — BP 110/64 | Ht 69.5 in | Wt 216.6 lb

## 2015-10-06 DIAGNOSIS — N644 Mastodynia: Secondary | ICD-10-CM

## 2015-10-06 DIAGNOSIS — N6310 Unspecified lump in the right breast, unspecified quadrant: Secondary | ICD-10-CM

## 2015-10-06 DIAGNOSIS — Z1239 Encounter for other screening for malignant neoplasm of breast: Secondary | ICD-10-CM

## 2015-10-06 NOTE — Patient Instructions (Signed)
Educational materials on self breast awareness given. Explained to Toni Rodriguez that she did not need a Pap smear today due to her history of a hysterectomy for benign reasons. Let patient know that she doesn't need any further Pap smears due to her history of a hysterectomy for benign reasons. Referred patient to the [facility] for diagnostic mammogram. Appointment scheduled for Thursday, Oct 06, 2015 at 1440. Toni Rodriguez verbalized understanding.  Hadyn Blanck, Arvil Chaco, RN 4:08 PM

## 2015-10-06 NOTE — Progress Notes (Signed)
Complaints of left breast pain that comes and goes. Patient rates pain at a 3-4 out of 10.  Pap Smear: Pap smear not completed today. Last Pap smear was 15 years ago and normal per patient. Per patient has a history of an abnormal Pap smear 25 years ago that a colposcopy was completed for follow up and all Pap smears were normal after colposcopy. Patient has a history of a hysterectomy for DUB 20 years. Patient no longer needs Pap smears due to her history of a hysterectomy for benign reasons per BCCCP and ACOG guidelines. No pap smear results in EPIC.  Physical exam: Breasts Left breast slightly larger than right breast. No skin abnormalities bilateral breasts. No nipple retraction bilateral breasts. No nipple discharge bilateral breasts. No lymphadenopathy. No lumps palpated bilateral breasts. Complaints of tenderness when palpated left inner breast on exam. Referred patient to the [facility] for diagnostic mammogram. Appointment scheduled for Thursday, Oct 06, 2015 at 1440.      Pelvic/Bimanual No Pap smear completed today since patient has a history of a hysterectomy for benign reasons. Pap smear not indicated per BCCCP guidelines.   Smoking History: Patient has never smoked.  Patient Navigation: Patient education provided. Access to services provided for patient through [place] program.   Colorectal Cancer Screening: Patient has no history of a colonoscopy. No complaints today.

## 2015-10-10 ENCOUNTER — Encounter (HOSPITAL_COMMUNITY): Payer: Self-pay | Admitting: *Deleted

## 2015-10-18 ENCOUNTER — Ambulatory Visit
Admission: RE | Admit: 2015-10-18 | Discharge: 2015-10-18 | Disposition: A | Discharge status: Home / Self Care | Payer: No Typology Code available for payment source | Source: Ambulatory Visit | Attending: Obstetrics and Gynecology | Admitting: Obstetrics and Gynecology

## 2015-10-18 ENCOUNTER — Other Ambulatory Visit (HOSPITAL_COMMUNITY): Payer: Self-pay | Admitting: Obstetrics and Gynecology

## 2015-10-18 DIAGNOSIS — N6310 Unspecified lump in the right breast, unspecified quadrant: Secondary | ICD-10-CM

## 2015-10-19 ENCOUNTER — Other Ambulatory Visit: Payer: Self-pay | Admitting: Obstetrics and Gynecology

## 2015-10-19 DIAGNOSIS — N6320 Unspecified lump in the left breast, unspecified quadrant: Secondary | ICD-10-CM

## 2015-10-25 ENCOUNTER — Ambulatory Visit
Admission: RE | Admit: 2015-10-25 | Discharge: 2015-10-25 | Disposition: A | Discharge status: Home / Self Care | Payer: No Typology Code available for payment source | Source: Ambulatory Visit | Attending: Obstetrics and Gynecology | Admitting: Obstetrics and Gynecology

## 2015-10-25 DIAGNOSIS — N6320 Unspecified lump in the left breast, unspecified quadrant: Secondary | ICD-10-CM

## 2015-10-28 ENCOUNTER — Encounter (HOSPITAL_COMMUNITY): Payer: Self-pay | Admitting: Emergency Medicine

## 2015-10-28 ENCOUNTER — Ambulatory Visit (HOSPITAL_COMMUNITY)
Admission: EM | Admit: 2015-10-28 | Discharge: 2015-10-28 | Disposition: A | Discharge status: Home / Self Care | Payer: Self-pay | Attending: Emergency Medicine | Admitting: Emergency Medicine

## 2015-10-28 DIAGNOSIS — R21 Rash and other nonspecific skin eruption: Secondary | ICD-10-CM

## 2015-10-28 DIAGNOSIS — L299 Pruritus, unspecified: Secondary | ICD-10-CM

## 2015-10-28 MED ORDER — PREDNISONE 50 MG PO TABS: ORAL_TABLET | ORAL | Status: DC

## 2015-10-28 MED ORDER — TRIAMCINOLONE ACETONIDE 0.1 % EX CREA: TOPICAL_CREAM | CUTANEOUS | Status: DC

## 2015-10-28 NOTE — ED Provider Notes (Signed)
CSN: [id number]     Arrival date & time 10/28/15  1413 History   None    Chief Complaint  Patient presents with  . Rash   (Consider location/radiation/quality/duration/timing/severity/associated sxs/prior Treatment) HPI  She is a 60 year old woman here for evaluation of rash and itching. She states this started about a week to a week and a half ago. It started with itching of her left buttock. She then noticed several red bumps. She developed similar spot on her left neck and some spots on her breasts. She states her whole body will H, but she tries not to scratch. She has tried multiple over-the-counter medications without improvement. She was prescribed cetirizine, but hasn't started taking it yet.  Past Medical History  Diagnosis Date  . UTI (lower urinary tract infection)   . Pneumonia    Past Surgical History  Procedure Laterality Date  . Abdominal hysterectomy    . Tonsillectomy     Family History  Problem Relation Age of Onset  . Diabetes Mother   . Hypertension Mother   . Diabetes Father   . Hypertension Father   . Diabetes Maternal Grandmother   . Diabetes Maternal Grandfather   . Hyperlipidemia Maternal Grandfather   . Diabetes Paternal Grandmother   . Diabetes Paternal Grandfather    Social History  Substance Use Topics  . Smoking status: Never Smoker   . Smokeless tobacco: None  . Alcohol Use: No   OB History    Gravida Para Term Preterm AB TAB SAB Ectopic Multiple Living   1         1     Review of Systems As in history of present illness Allergies  Penicillins  Home Medications   Prior to Admission medications   Medication Sig Start Date End Date Taking? Authorizing Provider  oxybutynin (DITROPAN) 5 MG tablet Take 1 tablet (5 mg total) by mouth 3 (three) times daily. 08/23/14  Yes Melony Overly, MD  albuterol (PROVENTIL HFA;VENTOLIN HFA) 108 (90 BASE) MCG/ACT inhaler Inhale 1-2 puffs into the lungs every 6 (six) hours as needed for wheezing or  shortness of breath. Or persistent coughing 05/12/14   Lutricia Feil, PA  cetirizine (ZYRTEC) 10 MG tablet Take 1 tablet (10 mg total) by mouth daily. 08/23/14   Melony Overly, MD  ipratropium (ATROVENT) 0.06 % nasal spray Place 2 sprays into both nostrils 4 (four) times daily. 08/23/14   Melony Overly, MD  OVER THE COUNTER MEDICATION Reported on 10/06/2015    Historical Provider, MD  predniSONE (DELTASONE) 50 MG tablet Take 1 pill daily for 5 days. 10/28/15   Melony Overly, MD  triamcinolone cream (KENALOG) 0.1 % Mix with a hypoallergenic lotion, such as eucerin or aveeno, and apply twice a day as needed. 10/28/15   Melony Overly, MD   Meds Ordered and Administered this Visit  Medications - No data to display  BP 117/56 mmHg  Pulse 64  Temp(Src) 97.8 F (36.6 C) (Oral)  Resp 16  SpO2 98% No data found.   Physical Exam  Constitutional: She is oriented to person, place, and time. She appears well-developed and well-nourished. No distress.  Cardiovascular: Normal rate.   Pulmonary/Chest: Effort normal.  Neurological: She is alert and oriented to person, place, and time.  Skin:  She has approximately 10 erythematous papules on the left butt cheek. She has a similar patch on the left neck as well as solitary lesions on the breast. No vesicles.  ED Course  Procedures (including critical care time)  Labs Review Labs Reviewed - No data to display  Imaging Review No results found.    MDM   1. Rash   2. Itching    I suspect a contact dermatitis versus bug bites. Treat with 5 days of prednisone. Triamcinolone cream as needed for itching. Follow-up if not improving in 1 week.    Melony Overly, MD 10/28/15 [id number]

## 2015-10-28 NOTE — Discharge Instructions (Signed)
These are either a reaction to something or bug bites. Mix the triamcinolone cream with a lotion such as Aveeno or Eucerin and apply twice a day as needed for itching. Take prednisone daily for 5 days. If this is not improving in 1 week, please come back for recheck.

## 2015-10-28 NOTE — ED Notes (Signed)
C/o rash all over onset 1.5 weeks... Denies new foods/meds/hygiene products... Denies fevers, chills A&O 4... No acute distress.

## 2015-11-15 ENCOUNTER — Ambulatory Visit: Payer: Self-pay | Admitting: Surgery

## 2015-11-15 DIAGNOSIS — R928 Other abnormal and inconclusive findings on diagnostic imaging of breast: Secondary | ICD-10-CM

## 2016-09-08 ENCOUNTER — Encounter (HOSPITAL_COMMUNITY): Payer: Self-pay | Admitting: Emergency Medicine

## 2016-09-08 ENCOUNTER — Ambulatory Visit (HOSPITAL_COMMUNITY)
Admission: EM | Admit: 2016-09-08 | Discharge: 2016-09-08 | Disposition: A | Discharge status: Home / Self Care | Payer: Self-pay | Attending: Internal Medicine | Admitting: Internal Medicine

## 2016-09-08 ENCOUNTER — Ambulatory Visit (INDEPENDENT_AMBULATORY_CARE_PROVIDER_SITE_OTHER): Payer: Self-pay

## 2016-09-08 DIAGNOSIS — J181 Lobar pneumonia, unspecified organism: Secondary | ICD-10-CM

## 2016-09-08 DIAGNOSIS — R05 Cough: Secondary | ICD-10-CM

## 2016-09-08 DIAGNOSIS — R062 Wheezing: Secondary | ICD-10-CM

## 2016-09-08 DIAGNOSIS — J189 Pneumonia, unspecified organism: Secondary | ICD-10-CM

## 2016-09-08 MED ORDER — AZITHROMYCIN 250 MG PO TABS: 250.0000 mg | ORAL_TABLET | Freq: Every day | ORAL | 0 refills | Status: DC

## 2016-09-08 MED ORDER — BENZONATATE 100 MG PO CAPS: 100.0000 mg | ORAL_CAPSULE | Freq: Three times a day (TID) | ORAL | 0 refills | Status: DC

## 2016-09-08 MED ORDER — PREDNISONE 50 MG PO TABS: ORAL_TABLET | ORAL | 0 refills | Status: DC

## 2016-09-08 NOTE — ED Triage Notes (Signed)
Sweats, chills, chest congestion and wheeze for a week.  History of pneumonia.

## 2016-09-08 NOTE — ED Provider Notes (Signed)
CSN: [id number]     Arrival date & time 09/08/16  1304 History   First MD Initiated Contact with Patient 09/08/16 1426     Chief Complaint  Patient presents with  . URI   (Consider location/radiation/quality/duration/timing/severity/associated sxs/prior Treatment) 61 year old female presents to clinic with a one-week history of cough, congestion, sneezing, runny nose, itchy eyes, and wheezing.   The history is provided by the patient.  URI  Presenting symptoms: congestion, cough, fatigue and rhinorrhea   Presenting symptoms: no fever and no sore throat   Cough:    Cough characteristics:  Non-productive, dry and hacking   Sputum characteristics:  Clear   Severity:  Moderate   Onset quality:  Gradual   Duration:  1 week   Timing:  Constant   Progression:  Worsening   Chronicity:  New Severity:  Moderate Onset quality:  Gradual Timing:  Constant Progression:  Unchanged Chronicity:  Recurrent Relieved by:  OTC medications Worsened by:  Nothing Associated symptoms: headaches, sneezing and wheezing   Associated symptoms: no myalgias, no neck pain, no sinus pain and no swollen glands     Past Medical History:  Diagnosis Date  . Pneumonia   . UTI (lower urinary tract infection)    Past Surgical History:  Procedure Laterality Date  . ABDOMINAL HYSTERECTOMY    . TONSILLECTOMY     Family History  Problem Relation Age of Onset  . Diabetes Mother   . Hypertension Mother   . Diabetes Father   . Hypertension Father   . Diabetes Maternal Grandmother   . Diabetes Maternal Grandfather   . Hyperlipidemia Maternal Grandfather   . Diabetes Paternal Grandmother   . Diabetes Paternal Grandfather    Social History  Substance Use Topics  . Smoking status: Never Smoker  . Smokeless tobacco: Not on file  . Alcohol use No   OB History    Gravida Para Term Preterm AB Living   1         1   SAB TAB Ectopic Multiple Live Births                 Review of Systems  Constitutional:  Positive for fatigue. Negative for appetite change, chills and fever.  HENT: Positive for congestion, rhinorrhea and sneezing. Negative for sinus pain, sinus pressure and sore throat.   Eyes: Positive for redness and itching.  Respiratory: Positive for cough and wheezing.   Cardiovascular: Negative for chest pain and palpitations.  Gastrointestinal: Negative for abdominal pain, diarrhea, nausea and vomiting.  Genitourinary: Negative for frequency and urgency.  Musculoskeletal: Negative for myalgias, neck pain and neck stiffness.  Skin: Negative for color change and rash.  Neurological: Positive for headaches. Negative for dizziness.    Allergies  Penicillins  Home Medications   Prior to Admission medications   Medication Sig Start Date End Date Taking? Authorizing Provider  albuterol (PROVENTIL HFA;VENTOLIN HFA) 108 (90 BASE) MCG/ACT inhaler Inhale 1-2 puffs into the lungs every 6 (six) hours as needed for wheezing or shortness of breath. Or persistent coughing 05/12/14  Yes Lutricia Feil, PA  cetirizine (ZYRTEC) 10 MG tablet Take 1 tablet (10 mg total) by mouth daily. 08/23/14  Yes Melony Overly, MD  dextromethorphan-guaiFENesin [facility] DM) 30-600 MG 12hr tablet Take 1 tablet by mouth 2 (two) times daily.   Yes Historical Provider, MD  azithromycin (ZITHROMAX) 250 MG tablet Take 1 tablet (250 mg total) by mouth daily. Take first 2 tablets together, then 1 every day  until finished. 09/08/16   Barnet Glasgow, NP  benzonatate (TESSALON) 100 MG capsule Take 1 capsule (100 mg total) by mouth every 8 (eight) hours. 09/08/16   Barnet Glasgow, NP  ipratropium (ATROVENT) 0.06 % nasal spray Place 2 sprays into both nostrils 4 (four) times daily. 08/23/14   Melony Overly, MD  OVER THE COUNTER MEDICATION Reported on 10/06/2015    Historical Provider, MD  oxybutynin (DITROPAN) 5 MG tablet Take 1 tablet (5 mg total) by mouth 3 (three) times daily. 08/23/14   Melony Overly, MD  predniSONE (DELTASONE)  50 MG tablet Take 1 tablet daily with food 09/08/16   Barnet Glasgow, NP  triamcinolone cream (KENALOG) 0.1 % Mix with a hypoallergenic lotion, such as eucerin or aveeno, and apply twice a day as needed. 10/28/15   Melony Overly, MD   Meds Ordered and Administered this Visit  Medications - No data to display  BP 105/61 (BP Location: Left Arm) Comment (BP Location): large cuff  Pulse 83   Temp 98.9 F (37.2 C) (Oral)   Resp 20   SpO2 95%  No data found.   Physical Exam  Constitutional: She is oriented to person, place, and time. She appears well-developed and well-nourished. No distress.  HENT:  Head: Normocephalic and atraumatic.  Right Ear: External ear normal.  Left Ear: External ear normal.  Mouth/Throat: Oropharynx is clear and moist.  Bilateral injected conjunctiva, bilateral periorbital edema. Presence of rhinorrhea, and boggy turbinates.  Eyes: Right eye exhibits no discharge. Left eye exhibits no discharge.  Neck: Normal range of motion.  Cardiovascular: Normal rate and regular rhythm.   Pulmonary/Chest: Effort normal and breath sounds normal.  Rhonchi heard on auscultation left lower fields.  Abdominal: Soft. Bowel sounds are normal.  Lymphadenopathy:    She has no cervical adenopathy.  Neurological: She is alert and oriented to person, place, and time.  Skin: Skin is warm and dry. Capillary refill takes less than 2 seconds. She is not diaphoretic.  Psychiatric: She has a normal mood and affect. Her behavior is normal.  Nursing note and vitals reviewed.   Urgent Care Course     Procedures (including critical care time)  Labs Review Labs Reviewed - No data to display  Imaging Review Dg Chest 2 View  Result Date: 09/08/2016 CLINICAL DATA:  Cough and wheezing for 1 week. EXAM: CHEST  2 VIEW COMPARISON:  June 06, 2014 FINDINGS: No pneumothorax. The heart size borderline. The hila and mediastinum are normal. Haziness in the lung bases could represent atelectasis or  early infiltrate. Recommend follow-up to resolution. No other acute abnormalities. IMPRESSION: Haziness in the lung bases could represent atelectasis or early infiltrate. Recommend follow-up to resolution. Electronically Signed   By: Dorise Bullion III M.D   On: 09/08/2016 15:09     MDM   1. Community acquired pneumonia of left lower lobe of lung ([place])     Patient has past history of pneumonia and states her current symptoms are consistent with a previous diagnosis. Chest Xray consistent with early signs of infiltrate, treating for community acquired pneumonia. Encouraged to use her inhaler every 4-6 hours, given Rx for Azithromycin, Tessalon, and prednisone. Also recommended over-the-counter Claritin as well. Recommended she follow up with primary care in 1-2 weeks for reimaging and reevaluation to ensure clearance.     Barnet Glasgow, NP 09/08/16 1538

## 2016-09-08 NOTE — Discharge Instructions (Signed)
I am treating you for the early stages of pneumonia. I have prescribed Azithromycin. Take 2 tablets today, then 1 tablet daily till finished. I have also prescribed a steroid called prednisone. Take one tablet daily with food. For cough, I have prescribed a medication called Tessalon. Take 1 tablet every 8 hours as needed for your cough. I also recommend taking over-the-counter Claritin, or Zyrtec daily for congestion and other symptoms. I would also recommend following up with your primary care provider in one to 2 weeks to ensure clearance in your lungs. Should your symptoms fail to improve or worsen, follow up with your primary care provider, or return to clinic.

## 2016-10-10 ENCOUNTER — Ambulatory Visit (INDEPENDENT_AMBULATORY_CARE_PROVIDER_SITE_OTHER): Payer: Self-pay

## 2016-10-10 ENCOUNTER — Ambulatory Visit (HOSPITAL_COMMUNITY)
Admission: EM | Admit: 2016-10-10 | Discharge: 2016-10-10 | Disposition: A | Discharge status: Home / Self Care | Payer: Self-pay | Attending: Internal Medicine | Admitting: Internal Medicine

## 2016-10-10 ENCOUNTER — Encounter (HOSPITAL_COMMUNITY): Payer: Self-pay | Admitting: Emergency Medicine

## 2016-10-10 DIAGNOSIS — M549 Dorsalgia, unspecified: Secondary | ICD-10-CM

## 2016-10-10 DIAGNOSIS — R059 Cough, unspecified: Secondary | ICD-10-CM

## 2016-10-10 DIAGNOSIS — R05 Cough: Secondary | ICD-10-CM

## 2016-10-10 DIAGNOSIS — R0602 Shortness of breath: Secondary | ICD-10-CM

## 2016-10-10 DIAGNOSIS — M546 Pain in thoracic spine: Secondary | ICD-10-CM

## 2016-10-10 MED ORDER — TRAMADOL HCL 50 MG PO TABS: 50.0000 mg | ORAL_TABLET | Freq: Four times a day (QID) | ORAL | 0 refills | Status: DC | PRN

## 2016-10-10 MED ORDER — NAPROXEN 250 MG PO TABS: 250.0000 mg | ORAL_TABLET | Freq: Two times a day (BID) | ORAL | 0 refills | Status: DC

## 2016-10-10 NOTE — ED Provider Notes (Signed)
CSN: [id number]     Arrival date & time 10/10/16  1951 History   First MD Initiated Contact with Patient 10/10/16 2046     Chief Complaint  Patient presents with  . URI   (Consider location/radiation/quality/duration/timing/severity/associated sxs/prior Treatment) 61 year old female is complaining of right mid back pain. Started about 2 weeks ago but became worse in the past week and more so in the past 2 days. She states that she had been moving objects a couple weeks ago and this past Sunday and did not have as much pain during those particular days however she is now having more pain especially with movement, occasionally with deep breath. Pain is located to the right mid to upper back. Denies cough, fever or shortness of breath. She was diagnosed with pneumonia nearly a month ago and treated with antibiotics. She states she is feeling better from that but because she has developed recent cough in the chest pain she wanted to make sure that the pneumonia was not coming back. Denies fever but states she has been sweating a lot.      Past Medical History:  Diagnosis Date  . Pneumonia   . UTI (lower urinary tract infection)    Past Surgical History:  Procedure Laterality Date  . ABDOMINAL HYSTERECTOMY    . TONSILLECTOMY     Family History  Problem Relation Age of Onset  . Diabetes Mother   . Hypertension Mother   . Diabetes Father   . Hypertension Father   . Diabetes Maternal Grandmother   . Diabetes Maternal Grandfather   . Hyperlipidemia Maternal Grandfather   . Diabetes Paternal Grandmother   . Diabetes Paternal Grandfather    Social History  Substance Use Topics  . Smoking status: Never Smoker  . Smokeless tobacco: Not on file  . Alcohol use No   OB History    Gravida Para Term Preterm AB Living   1         1   SAB TAB Ectopic Multiple Live Births                 Review of Systems  Constitutional: Positive for activity change and diaphoresis. Negative for fever.   HENT: Negative.   Respiratory: Positive for cough. Negative for shortness of breath and wheezing.   Cardiovascular: Negative for chest pain and leg swelling.  Gastrointestinal: Negative.   Musculoskeletal: Positive for back pain.  Skin: Negative.   Psychiatric/Behavioral: Negative.   All other systems reviewed and are negative.   Allergies  Penicillins  Home Medications   Prior to Admission medications   Medication Sig Start Date End Date Taking? Authorizing Provider  benzonatate (TESSALON) 100 MG capsule Take 1 capsule (100 mg total) by mouth every 8 (eight) hours. 09/08/16  Yes Barnet Glasgow, NP  cetirizine (ZYRTEC) 10 MG tablet Take 1 tablet (10 mg total) by mouth daily. 08/23/14  Yes Melony Overly, MD  predniSONE (DELTASONE) 50 MG tablet Take 1 tablet daily with food 09/08/16  Yes Barnet Glasgow, NP  albuterol (PROVENTIL HFA;VENTOLIN HFA) 108 (90 BASE) MCG/ACT inhaler Inhale 1-2 puffs into the lungs every 6 (six) hours as needed for wheezing or shortness of breath. Or persistent coughing 05/12/14   Presson, Annett Gula H, PA  dextromethorphan-guaiFENesin [facility] DM) 30-600 MG 12hr tablet Take 1 tablet by mouth 2 (two) times daily.    [provider]  ipratropium (ATROVENT) 0.06 % nasal spray Place 2 sprays into both nostrils 4 (four) times daily. 08/23/14  Melony Overly, MD  naproxen (NAPROSYN) 250 MG tablet Take 1 tablet (250 mg total) by mouth 2 (two) times daily with a meal. Prn back pain 10/10/16   Janne Napoleon, NP  OVER THE COUNTER MEDICATION Reported on 10/06/2015    [provider]  oxybutynin (DITROPAN) 5 MG tablet Take 1 tablet (5 mg total) by mouth 3 (three) times daily. 08/23/14   Melony Overly, MD  traMADol (ULTRAM) 50 MG tablet Take 1 tablet (50 mg total) by mouth every 6 (six) hours as needed. 10/10/16   Janne Napoleon, NP  triamcinolone cream (KENALOG) 0.1 % Mix with a hypoallergenic lotion, such as eucerin or aveeno, and apply twice a day as needed. 10/28/15    Melony Overly, MD   Meds Ordered and Administered this Visit  Medications - No data to display  BP 108/70 (BP Location: Right Arm)   Pulse 82   Temp 98.2 F (36.8 C) (Oral)   Resp 20   SpO2 98%  No data found.   Physical Exam  Constitutional: She is oriented to person, place, and time. She appears well-developed and well-nourished. No distress.  HENT:  Head: Normocephalic and atraumatic.  Eyes: EOM are normal.  Neck: Neck supple.  Cardiovascular: Normal rate, regular rhythm, normal heart sounds and intact distal pulses.   Pulmonary/Chest: Effort normal and breath sounds normal. No respiratory distress. She has no wheezes. She has no rales. She exhibits tenderness.  There is marked reproducible tenderness to the right mid to upper back chest inferior to the scapula. Palpation of the ribs, intercostal muscles and overlying muscles are exquisitely tender. Movement in a chair, rising to a standing position or getting onto the table produces pain in that localized area of the back.  Musculoskeletal: She exhibits tenderness.  Neurological: She is alert and oriented to person, place, and time.  Skin: Skin is warm and dry.  Psychiatric: She has a normal mood and affect. Her behavior is normal.  Nursing note and vitals reviewed.   Urgent Care Course     Procedures (including critical care time)  Labs Review Labs Reviewed - No data to display  Imaging Review Dg Chest 2 View  Result Date: 10/10/2016 CLINICAL DATA:  Right chest and back pain, shortness of breath, and nonproductive cough. Recent pneumonia. EXAM: CHEST  2 VIEW COMPARISON:  09/08/2016 FINDINGS: The heart size and mediastinal contours are within normal limits. Mild bibasilar scarring noted. No evidence of pulmonary infiltrate or edema. No evidence of pleural effusion or pneumothorax. The visualized skeletal structures are unremarkable. IMPRESSION: No active cardiopulmonary disease. Electronically Signed   By: Earle Gell  M.D.   On: 10/10/2016 20:53     Visual Acuity Review  Right Eye Distance:   Left Eye Distance:   Bilateral Distance:    Right Eye Near:   Left Eye Near:    Bilateral Near:         MDM   1. Cough   2. Acute right-sided thoracic back pain   3. Musculoskeletal back pain    Your chest x-ray is clear, no new findings and no new cardiopulmonary disease such as pneumonia. The pain in her back is muscular type pain surrounding the ribs and the right upper back. Apply heat to this area frequently. No heavy lifting or frequent lifting and movements that produce pain. Meds ordered this encounter  Medications  . traMADol (ULTRAM) 50 MG tablet    Sig: Take 1 tablet (50 mg total) by mouth  every 6 (six) hours as needed.    Dispense:  15 tablet    Refill:  0    Order Specific Question:   Supervising Provider    Answer:   Sherlene Shams [[id number]]  . naproxen (NAPROSYN) 250 MG tablet    Sig: Take 1 tablet (250 mg total) by mouth 2 (two) times daily with a meal. Prn back pain    Dispense:  20 tablet    Refill:  0    Order Specific Question:   Supervising Provider    Answer:   Sherlene Shams [[id number]]       Janne Napoleon, NP 10/10/16 2104

## 2016-10-10 NOTE — ED Triage Notes (Signed)
Patient recently treated for pneumonia.  Now patient is having pain in right upper back and thinks this is very similar to when she had pneumonia.  Continues to have a cough.  Non-productive cough.  Unknown fever.  Patient is having sweating episodes, more than normal.

## 2016-10-10 NOTE — Discharge Instructions (Signed)
Your chest x-ray is clear, no new findings and no new cardiopulmonary disease such as pneumonia. The pain in her back is muscular type pain surrounding the ribs and the right upper back. Apply heat to this area frequently. No heavy lifting or frequent lifting and movements that produce pain.

## 2016-10-12 ENCOUNTER — Other Ambulatory Visit (HOSPITAL_COMMUNITY): Payer: Self-pay | Admitting: *Deleted

## 2016-10-12 DIAGNOSIS — N6320 Unspecified lump in the left breast, unspecified quadrant: Secondary | ICD-10-CM

## 2016-11-01 ENCOUNTER — Ambulatory Visit
Admission: RE | Admit: 2016-11-01 | Discharge: 2016-11-01 | Disposition: A | Discharge status: Home / Self Care | Payer: Self-pay | Source: Ambulatory Visit | Attending: Obstetrics and Gynecology | Admitting: Obstetrics and Gynecology

## 2016-11-01 ENCOUNTER — Encounter (HOSPITAL_COMMUNITY): Payer: Self-pay

## 2016-11-01 ENCOUNTER — Ambulatory Visit (HOSPITAL_COMMUNITY)
Admission: RE | Admit: 2016-11-01 | Discharge: 2016-11-01 | Disposition: A | Discharge status: Home / Self Care | Payer: Self-pay | Source: Ambulatory Visit | Attending: Obstetrics and Gynecology | Admitting: Obstetrics and Gynecology

## 2016-11-01 ENCOUNTER — Ambulatory Visit
Admission: RE | Admit: 2016-11-01 | Discharge: 2016-11-01 | Disposition: A | Discharge status: Home / Self Care | Payer: No Typology Code available for payment source | Source: Ambulatory Visit | Attending: Obstetrics and Gynecology | Admitting: Obstetrics and Gynecology

## 2016-11-01 VITALS — BP 122/80 | Ht 71.0 in | Wt 217.0 lb

## 2016-11-01 DIAGNOSIS — Z1239 Encounter for other screening for malignant neoplasm of breast: Secondary | ICD-10-CM

## 2016-11-01 DIAGNOSIS — N6320 Unspecified lump in the left breast, unspecified quadrant: Secondary | ICD-10-CM

## 2016-11-01 NOTE — Progress Notes (Addendum)
Complaints of bilateral breast that is greater within the left breast since May 2017. Patient states the pain comes and goes. Patient rates the pain at a 4 out of 10.  Pap Smear: Pap smear not completed today. Last Pap smear was 16 years ago and normal per patient. Per patient has a history of an abnormal Pap smear 26 years ago that a colposcopy was completed for follow up and all Pap smears were normal after colposcopy. Patient has a history of a hysterectomy for DUB 20 years. Patient no longer needs Pap smears due to her history of a hysterectomy for benign reasons per BCCCP and ACOG guidelines. No pap smear results in EPIC.  Physical exam: Breasts Left breast slightly larger than right breast. No skin abnormalities bilateral breasts. No nipple retraction bilateral breasts. No nipple discharge bilateral breasts. No lymphadenopathy. No lumps palpated bilateral breasts. Complaints of tenderness when palpated left lower breast at 6 o'clock on exam. Referred patient to the [facility] for diagnostic mammogram. Appointment scheduled for Thursday, Oct 06, 2015 at 1440.          Pelvic/Bimanual No Pap smear completed today since patient has a history of a hysterectomy for benign reasons. Pap smear not indicated per BCCCP guidelines.   Smoking History: Patient has never smoked.  Patient Navigation: Patient education provided. Access to services provided for patient through [facility] program.   Colorectal Cancer Screening: Per patient has never had a colonoscopy completed. No complaints today. FIT Test given to patient to complete and return to BCCCP.

## 2016-11-01 NOTE — Patient Instructions (Signed)
Explained breast self awareness with Avanell Shackleton. Patient did not need a Pap smear today due to patient has a history of a hysterectomy for benign reasons. Let patient know that she doesn't need any further Pap smears due to her history of a hysterectomy for benign reasons. Referred patient to the [facility] for diagnostic mammogram. Appointment scheduled for Thursday, Oct 06, 2015 at 1440. Avanell Shackleton verbalized understanding.  Brannock, Arvil Chaco, RN 11:29 AM

## 2016-11-01 NOTE — Addendum Note (Signed)
Encounter addended by: Loletta Parish, RN on: 11/01/2016  2:32 PM<BR>    Actions taken: Sign clinical note

## 2016-11-02 ENCOUNTER — Encounter (HOSPITAL_COMMUNITY): Payer: Self-pay | Admitting: *Deleted

## 2016-11-06 ENCOUNTER — Other Ambulatory Visit: Payer: Self-pay

## 2016-11-14 LAB — FECAL OCCULT BLOOD, IMMUNOCHEMICAL: Fecal Occult Bld: POSITIVE — AB

## 2016-11-19 ENCOUNTER — Encounter (HOSPITAL_COMMUNITY): Payer: Self-pay | Admitting: *Deleted

## 2017-01-09 ENCOUNTER — Telehealth (HOSPITAL_COMMUNITY): Payer: Self-pay | Admitting: *Deleted

## 2017-01-09 NOTE — Telephone Encounter (Signed)
Called patient to follow up. Patient was referred to [facility] by the [facility]. Patient stated she did not keep appointment due to was switched to a different surgeon and there was some confusion since she was not a new patient. Patient stated she will call and reschedule her surgical consultation appointment. Explained to patient BCCCP will cover the surgical consultation and the importance of follow up. Patient verbalized understanding.

## 2017-01-18 ENCOUNTER — Other Ambulatory Visit: Payer: Self-pay | Admitting: Gastroenterology

## 2017-03-04 ENCOUNTER — Encounter (HOSPITAL_COMMUNITY): Payer: Self-pay | Admitting: *Deleted

## 2017-03-11 NOTE — Progress Notes (Signed)
Patient reports she saw [facility] for cough week of 03/04/2017.  Patient states she was placed on antibiotic.  Still with continued cough.  Instructed patient to call office of Dr Therisa Doyne and let them be aware.  Patient voiced understanding.  Informed patient would see if office reschedules her for another ay and would watch schedule.

## 2017-03-25 ENCOUNTER — Other Ambulatory Visit: Payer: Self-pay | Admitting: Gastroenterology

## 2017-05-06 ENCOUNTER — Encounter (HOSPITAL_COMMUNITY): Payer: Self-pay | Admitting: Emergency Medicine

## 2017-05-06 ENCOUNTER — Other Ambulatory Visit: Payer: Self-pay

## 2017-05-20 ENCOUNTER — Encounter (HOSPITAL_COMMUNITY): Payer: Self-pay | Admitting: Emergency Medicine

## 2017-06-03 ENCOUNTER — Encounter (HOSPITAL_COMMUNITY)
Admission: RE | Disposition: A | Discharge status: Home / Self Care | Payer: Self-pay | Source: Ambulatory Visit | Attending: Gastroenterology

## 2017-06-03 ENCOUNTER — Encounter (HOSPITAL_COMMUNITY): Payer: Self-pay

## 2017-06-03 ENCOUNTER — Encounter (HOSPITAL_COMMUNITY): Payer: Self-pay | Admitting: Anesthesiology

## 2017-06-03 ENCOUNTER — Ambulatory Visit (HOSPITAL_COMMUNITY)
Admission: RE | Admit: 2017-06-03 | Discharge: 2017-06-03 | Disposition: A | Discharge status: Home / Self Care | Payer: No Typology Code available for payment source | Source: Ambulatory Visit | Attending: Gastroenterology | Admitting: Gastroenterology

## 2017-06-03 ENCOUNTER — Other Ambulatory Visit: Payer: Self-pay

## 2017-06-03 ENCOUNTER — Ambulatory Visit (HOSPITAL_COMMUNITY): Payer: Self-pay | Admitting: Anesthesiology

## 2017-06-03 DIAGNOSIS — Z87891 Personal history of nicotine dependence: Secondary | ICD-10-CM | POA: Insufficient documentation

## 2017-06-03 DIAGNOSIS — Z880 Allergy status to penicillin: Secondary | ICD-10-CM | POA: Insufficient documentation

## 2017-06-03 DIAGNOSIS — D125 Benign neoplasm of sigmoid colon: Secondary | ICD-10-CM | POA: Insufficient documentation

## 2017-06-03 DIAGNOSIS — D123 Benign neoplasm of transverse colon: Secondary | ICD-10-CM | POA: Insufficient documentation

## 2017-06-03 DIAGNOSIS — C186 Malignant neoplasm of descending colon: Secondary | ICD-10-CM | POA: Insufficient documentation

## 2017-06-03 HISTORY — PX: COLONOSCOPY WITH PROPOFOL: SHX5780

## 2017-06-03 SURGERY — COLONOSCOPY WITH PROPOFOL
Anesthesia: Monitor Anesthesia Care

## 2017-06-03 MED ORDER — LIDOCAINE 2% (20 MG/ML) 5 ML SYRINGE
INTRAMUSCULAR | Status: DC | PRN
  Administered 2017-06-03: 50 mg via INTRAVENOUS

## 2017-06-03 MED ORDER — LACTATED RINGERS IV SOLN
INTRAVENOUS | Status: DC
  Administered 2017-06-03: 09:00:00 via INTRAVENOUS

## 2017-06-03 MED ORDER — PROPOFOL 500 MG/50ML IV EMUL
INTRAVENOUS | Status: DC | PRN
  Administered 2017-06-03: 75 ug/kg/min via INTRAVENOUS

## 2017-06-03 MED ORDER — PROPOFOL 10 MG/ML IV BOLUS
INTRAVENOUS | Status: AC
  Filled 2017-06-03: qty 40

## 2017-06-03 MED ORDER — PROPOFOL 10 MG/ML IV BOLUS
INTRAVENOUS | Status: AC
  Filled 2017-06-03: qty 20

## 2017-06-03 MED ORDER — SODIUM CHLORIDE 0.9 % IV SOLN: INTRAVENOUS | Status: DC

## 2017-06-03 MED ORDER — PROPOFOL 10 MG/ML IV BOLUS
INTRAVENOUS | Status: DC | PRN
Start: 2017-06-03 — End: 2017-06-03
  Administered 2017-06-03: 20 mg via INTRAVENOUS
  Administered 2017-06-03: 40 mg via INTRAVENOUS

## 2017-06-03 NOTE — Discharge Instructions (Signed)

## 2017-06-03 NOTE — Op Note (Signed)
[facility] Patient Name: Toni Rodriguez Procedure Date: 06/03/2017 MRN: [id number] Attending MD: Ronnette Juniper , MD Date of Birth: May 15, 1956 CSN: [id number] Age: 61 Admit Type: Outpatient Procedure:                Colonoscopy Indications:              This is the patient's first colonoscopy, Positive                            fecal immunochemical test Providers:                Ronnette Juniper, MD, Cleda Daub, RN, William Dalton,                            Technician Referring MD:              Medicines:                Monitored Anesthesia Care Complications:            No immediate complications. Estimated Blood Loss:     Estimated blood loss: none. Procedure:                Pre-Anesthesia Assessment:                           - Prior to the procedure, a History and Physical                            was performed, and patient medications and                            allergies were reviewed. The patient's tolerance of                            previous anesthesia was also reviewed. The risks                            and benefits of the procedure and the sedation                            options and risks were discussed with the patient.                            All questions were answered, and informed consent                            was obtained. Prior Anticoagulants: The patient has                            taken no previous anticoagulant or antiplatelet                            agents. ASA Grade Assessment: II - A patient with                            mild systemic  disease. After reviewing the risks                            and benefits, the patient was deemed in                            satisfactory condition to undergo the procedure.                           After obtaining informed consent, the colonoscope                            was passed under direct vision. Throughout the                            procedure, the patient's blood  pressure, pulse, and                            oxygen saturations were monitored continuously. The                            EC-[id number] ([id number]) scope was introduced through                            the anus and advanced to the the terminal ileum.                            The colonoscopy was performed without difficulty.                            The patient tolerated the procedure well. The                            quality of the bowel preparation was good. The                            terminal ileum, ileocecal valve, appendiceal                            orifice, and rectum were photographed. Scope In: 9:27:26 AM Scope Out: 9:54:26 AM Scope Withdrawal Time: 0 hours 6 minutes 52 seconds  Total Procedure Duration: 0 hours 27 minutes 0 seconds  Findings:      The perianal and digital rectal examinations were normal.      A 20 mm polyp was found in the sigmoid colon. The polyp was       pedunculated. The polyp was removed with a hot snare. Resection and       retrieval were complete.      A 25 mm polyp was found in the descending colon. The polyp was       pedunculated. The polyp was removed with a hot snare. Resection and       retrieval were complete. The polyp had to be retrieved with a net.      A 6 mm polyp was found in the transverse colon. The polyp was sessile.  The polyp was removed with a piecemeal technique using a cold biopsy       forceps. Resection and retrieval were complete.      The terminal ileum appeared normal.      The exam was otherwise without abnormality.      The retroflexed view of the distal rectum and anal verge was normal and       showed no anal or rectal abnormalities. Impression:               - One 20 mm polyp in the sigmoid colon, removed                            with a hot snare. Resected and retrieved.                           - One 25 mm polyp in the descending colon, removed                            with a hot snare. Resected  and retrieved with a net.                           - One 6 mm polyp in the transverse colon, removed                            piecemeal using a cold biopsy forceps. Resected and                            retrieved.                           - The examined portion of the ileum was normal.                           - The examination was otherwise normal.                           - The distal rectum and anal verge are normal on                            retroflexion view. Moderate Sedation:      Patient did not receive moderate sedation for this procedure, but       instead received monitored anesthesia care. Recommendation:           - Patient has a contact number available for                            emergencies. The signs and symptoms of potential                            delayed complications were discussed with the                            patient. Return to normal activities tomorrow.  Written discharge instructions were provided to the                            patient.                           - Resume regular diet.                           - Continue present medications.                           - Await pathology results.                           - Repeat colonoscopy in 1 year for surveillance. Procedure Code(s):        --- Professional ---                           [phone], Colonoscopy, flexible; with removal of                            tumor(s), polyp(s), or other lesion(s) by snare                            technique                           [id number], 48, Colonoscopy, flexible; with biopsy,                            single or multiple Diagnosis Code(s):        --- Professional ---                           D12.5, Benign neoplasm of sigmoid colon                           D12.4, Benign neoplasm of descending colon                           D12.3, Benign neoplasm of transverse colon (hepatic                            flexure or  splenic flexure)                           R19.5, Other fecal abnormalities CPT copyright 2016 American Medical Association. All rights reserved. The codes documented in this report are preliminary and upon coder review may  be revised to meet current compliance requirements. Ronnette Juniper, MD 06/03/2017 9:59:10 AM This report has been signed electronically. Number of Addenda: 0

## 2017-06-03 NOTE — Transfer of Care (Signed)
Immediate Anesthesia Transfer of Care Note  [name]  Procedure(s) Performed: Procedure(s): COLONOSCOPY WITH PROPOFOL (N/A)  Patient Location: PACU  Anesthesia Type:MAC  Level of Consciousness:  sedated, patient cooperative and responds to stimulation  Airway & Oxygen Therapy:Patient Spontanous Breathing and Patient connected to face mask oxgen  Post-op Assessment:  Report given to PACU RN and Post -op Vital signs reviewed and stable  Post vital signs:  Reviewed and stable  Last Vitals:  Vitals:   06/03/17 0831  BP: (!) 111/58  Pulse: 83  Resp: 12  Temp: 36.6 C  SpO2: 56%    Complications: No apparent anesthesia complications

## 2017-06-03 NOTE — H&P (View-Only) (Signed)
History of Present Illness  General:  61/female was referred for a diagnostic colonoscopy as stool was guaiac positive. She has not had a colonoscopy before. She has 1-2 Bm a day, stools are soft, denies blood in stool or black stools. Denies abdominal pain, unintentional weight loss, loss of appetite, acid reflux, difficulty or pain on swallowing. There is no family history of colon cancer.   Current Medications  Taking   Albuterol Sulfate HFA 108 (90 Base) MCG/ACT Aerosol Solution 2 puffs as needed Inhalation every 6 hrs   OTC 2-4 capsule wild yams by mouth daily   Oxybutynin 3.9 MG/24HR Patch Twice Weekly 1 patch to skin Transdermal Two times a Week   Allergy(DiphenhydrAMINE HCl) ? Tablet 1 tablet Orally as needed   Pain Relief(APAP) ? Tablet 1 tablet Orally as needed   Medication List reviewed and reconciled with the patient    Past Medical History  Sinus problems.    Surgical History  partial hysterectomy   tonsilectomy    Family History  Father: alive  Mother: alive  2 brother(s) , 2 sister(s) . 1 son(s) .   No Family History of Colon Cancer, Polyps, or Liver Disease.   Social History  General:  Tobacco use  cigarettes: Former smoker quit 20 yrs ago no Alcohol.  no Recreational drug use.    Allergies  Penicillin: rash: Allergy   Hospitalization/Major Diagnostic Procedure  not in the past yr 01/2017   Review of Systems  CONSTITUTIONAL:  Chills No. Fatigue No. Fever No. Insomnia No. Night sweats No. Weight change No.  HEENT:  Change in vision No. Double vision No . Hoarseness No. Nose bleeds No. sore throat No. Sinus Problems YES. Glaucoma No.  CARDIOLOGY:  ByPass Surgery No. Poor Circulation No. Artificial Heart Valves No. High blood pressure No. History of Heart attack No. Irregular heart beat No. Known coronary artery disease No. Pacemaker/Defibrillator No.  RESPIRATORY:  Shortness of breath No. Cough YES. Excessive Sputum No. Using Oxygen No. Asthma No.  Bronchitis YES. Pneumonia No. Sleep Apnea No.  UROLOGY:  Interstitials Cystitis No. Incontinence No. Blood in urine No. Difficulty urinating No. Kidney disease No. Kidney stones No.  GASTROENTEROLOGY:  Abdominal pain No. Acid reflux No. Black stools No. Bloating/belching No. Change in bowel habits No. Constipation No. Dark tarry stools No. Diarrhea No. Difficulty swallowing No. Heartburn No. Incontinence of stool No. Indigestion: No. Lactose intolerance No. Nausea No. Rectal bleeding No. Vomiting No. Hepatitis/yellow jaundice No. History of Ulcers No. Use of Pain Medication No. Previous Colonoscopy No.  MUSCULOSKELETAL:  joint pain No. Arthritis No. Joint Replacement No.  NEUROLOGY:  Dizziness No. Fainting No. Headache No. Paralysis No. Seizures No. Strokes No. Weakness No. Alzheimer's No.  SKIN:  Rash No. Bruises easily No.  ENDOCRINOLOGY:  Diabetes No. High cholesterol No. Thyroid disorder No.  HEMATOLOGY/LYMPH:  Abnormal bleeding No. Anemia No. Enlarged lymph nodes No. Past blood transfusion No. Swollen glands No. Blood Clots No. Using Blood Thinners No.  INFECTIOUS DISEASE:  HIV/AIDS No. Tuberculosis No . Hepatitis No. Sexually Transmitted Disease No. MRSA No.  GI PROCEDURE:  no Pacemaker/ AICD, no. no Artificial heart valves. no MI/heart attack. no Abnormal heart rhythm. no Angina. no CVA. no Hypertension. no Hypotension. no Asthma, COPD. no Sleep apnea. no Seizure disorders. no Artificial joints. no Severe DJD. no Diabetes. no Significant headaches. no Vertigo. no Depression/anxiety. no Abnormal bleeding. no Kidney Disease. no Liver disease, no. no Chance of pregnancy. no Blood transfusion. no Method of Birth Control.  no Birth control pills.  GU/GYN:  Pregnant Now No. Trying to conceive No. Use Birth Control No. Heavy Periods No.       Vital Signs  Wt 217.2, Ht 70.5, BMI 30.72, Temp 98.4, Pulse sitting 88, BP sitting 132/82.   Examination  Gastroenterology:: GENERAL APPEARANCE:  Well developed, well nourished, no active distress, pleasant, no acute distress .  EYES: Lids and conjunctiva normal. Sclera normal, pupils equal and reactive .  ORAL CAVITY: Lips, teeth and gums are normal. Pharynx, tongue, mucosa normal .  SCLERA: anicteric .  NECK Full ROM, trachea midline, no thyromegaly or masses .  CARDIOVASCULAR PMI LS border. Normal RRR w/o murmers or gallops. No peripheral edema .  RESPIRATORY Breath sounds normal. Respiration even and unlabored .  ABDOMEN No masses palpated. Liver and spleen not palpated, normal. Bowel sounds normal, Abdomen not distended .  EXTREMITIES: No edema, pulses intact .  NEURO: normal strength and reflexes, cranial nerves II-XII grossly intact, normal gait .  PSYCH: mood/affect normal .     Assessments   1. Stool guaiac positive - R19.5 (Primary)   Treatment  1. Stool guaiac positive  IMAGING: Colonoscopy Notes: DDX hemorrhoids, AVMS, polyps, less likley malignancy. Will proceed with a diagnostic colonoscopy. The risks and benefits of the procedure were explained to the patient in details. She understands and verbalizes consent. She will be given written instrcutions, prescription for preparation and will be scheduled for the same.

## 2017-06-03 NOTE — Op Note (Signed)
Colonoscopy was done for positive blood detected in stool. No prior colonoscopy.  Prep was good. A pedunculated polyp of 20 mm was found in the sigmoid, removed via hot snare polypectomy and sent to pathology. A larger 25 mm pedunculated polyp was found in the descending, removed by hot snare polypectomy, retrieved with a basket and sent to pathology. A small 6 mm polyp was found in transverse colon, it was sessile and removed via biopsy polypectomy and sent to pathology. Rest of the colon appeared unremarkable. Terminal ileum appeared unremarkable. Retroflexion was normal.   Recommendations: Resume regular diet. Await pathology report. Repeat colonoscopy for surveillance in 1 year, unless indicated otherwise after pathology review.  Ronnette Juniper, M.D.

## 2017-06-03 NOTE — H&P (Signed)
History of Present Illness  General:  61/female was referred for a diagnostic colonoscopy as stool was guaiac positive. She has not had a colonoscopy before. She has 1-2 Bm a day, stools are soft, denies blood in stool or black stools. Denies abdominal pain, unintentional weight loss, loss of appetite, acid reflux, difficulty or pain on swallowing. There is no family history of colon cancer.   Current Medications  Taking   Albuterol Sulfate HFA 108 (90 Base) MCG/ACT Aerosol Solution 2 puffs as needed Inhalation every 6 hrs   OTC 2-4 capsule wild yams by mouth daily   Oxybutynin 3.9 MG/24HR Patch Twice Weekly 1 patch to skin Transdermal Two times a Week   Allergy(DiphenhydrAMINE HCl) ? Tablet 1 tablet Orally as needed   Pain Relief(APAP) ? Tablet 1 tablet Orally as needed   Medication List reviewed and reconciled with the patient    Past Medical History  Sinus problems.    Surgical History  partial hysterectomy   tonsilectomy    Family History  Father: alive  Mother: alive  2 brother(s) , 2 sister(s) . 1 son(s) .   No Family History of Colon Cancer, Polyps, or Liver Disease.   Social History  General:  Tobacco use  cigarettes: Former smoker quit 20 yrs ago no Alcohol.  no Recreational drug use.    Allergies  Penicillin: rash: Allergy   Hospitalization/Major Diagnostic Procedure  not in the past yr 01/2017   Review of Systems  CONSTITUTIONAL:  Chills No. Fatigue No. Fever No. Insomnia No. Night sweats No. Weight change No.  HEENT:  Change in vision No. Double vision No . Hoarseness No. Nose bleeds No. sore throat No. Sinus Problems YES. Glaucoma No.  CARDIOLOGY:  ByPass Surgery No. Poor Circulation No. Artificial Heart Valves No. High blood pressure No. History of Heart attack No. Irregular heart beat No. Known coronary artery disease No. Pacemaker/Defibrillator No.  RESPIRATORY:  Shortness of breath No. Cough YES. Excessive Sputum No. Using Oxygen No. Asthma No.  Bronchitis YES. Pneumonia No. Sleep Apnea No.  UROLOGY:  Interstitials Cystitis No. Incontinence No. Blood in urine No. Difficulty urinating No. Kidney disease No. Kidney stones No.  GASTROENTEROLOGY:  Abdominal pain No. Acid reflux No. Black stools No. Bloating/belching No. Change in bowel habits No. Constipation No. Dark tarry stools No. Diarrhea No. Difficulty swallowing No. Heartburn No. Incontinence of stool No. Indigestion: No. Lactose intolerance No. Nausea No. Rectal bleeding No. Vomiting No. Hepatitis/yellow jaundice No. History of Ulcers No. Use of Pain Medication No. Previous Colonoscopy No.  MUSCULOSKELETAL:  joint pain No. Arthritis No. Joint Replacement No.  NEUROLOGY:  Dizziness No. Fainting No. Headache No. Paralysis No. Seizures No. Strokes No. Weakness No. Alzheimer's No.  SKIN:  Rash No. Bruises easily No.  ENDOCRINOLOGY:  Diabetes No. High cholesterol No. Thyroid disorder No.  HEMATOLOGY/LYMPH:  Abnormal bleeding No. Anemia No. Enlarged lymph nodes No. Past blood transfusion No. Swollen glands No. Blood Clots No. Using Blood Thinners No.  INFECTIOUS DISEASE:  HIV/AIDS No. Tuberculosis No . Hepatitis No. Sexually Transmitted Disease No. MRSA No.  GI PROCEDURE:  no Pacemaker/ AICD, no. no Artificial heart valves. no MI/heart attack. no Abnormal heart rhythm. no Angina. no CVA. no Hypertension. no Hypotension. no Asthma, COPD. no Sleep apnea. no Seizure disorders. no Artificial joints. no Severe DJD. no Diabetes. no Significant headaches. no Vertigo. no Depression/anxiety. no Abnormal bleeding. no Kidney Disease. no Liver disease, no. no Chance of pregnancy. no Blood transfusion. no Method of Birth Control.  no Birth control pills.  GU/GYN:  Pregnant Now No. Trying to conceive No. Use Birth Control No. Heavy Periods No.       Vital Signs  Wt 217.2, Ht 70.5, BMI 30.72, Temp 98.4, Pulse sitting 88, BP sitting 132/82.   Examination  Gastroenterology:: GENERAL APPEARANCE:  Well developed, well nourished, no active distress, pleasant, no acute distress .  EYES: Lids and conjunctiva normal. Sclera normal, pupils equal and reactive .  ORAL CAVITY: Lips, teeth and gums are normal. Pharynx, tongue, mucosa normal .  SCLERA: anicteric .  NECK Full ROM, trachea midline, no thyromegaly or masses .  CARDIOVASCULAR PMI LS border. Normal RRR w/o murmers or gallops. No peripheral edema .  RESPIRATORY Breath sounds normal. Respiration even and unlabored .  ABDOMEN No masses palpated. Liver and spleen not palpated, normal. Bowel sounds normal, Abdomen not distended .  EXTREMITIES: No edema, pulses intact .  NEURO: normal strength and reflexes, cranial nerves II-XII grossly intact, normal gait .  PSYCH: mood/affect normal .     Assessments   1. Stool guaiac positive - R19.5 (Primary)   Treatment  1. Stool guaiac positive  IMAGING: Colonoscopy Notes: DDX hemorrhoids, AVMS, polyps, less likley malignancy. Will proceed with a diagnostic colonoscopy. The risks and benefits of the procedure were explained to the patient in details. She understands and verbalizes consent. She will be given written instrcutions, prescription for preparation and will be scheduled for the same.

## 2017-06-03 NOTE — Anesthesia Preprocedure Evaluation (Signed)
Anesthesia Evaluation  Patient identified by MRN, date of birth, ID band Patient awake    Reviewed: Allergy & Precautions, NPO status , Patient's Chart, lab work & pertinent test results  Airway Mallampati: II  TM Distance: >3 FB Neck ROM: Full    Dental no notable dental hx.    Pulmonary neg pulmonary ROS,    Pulmonary exam normal breath sounds clear to auscultation       Cardiovascular negative cardio ROS Normal cardiovascular exam Rhythm:Regular Rate:Normal     Neuro/Psych negative neurological ROS  negative psych ROS   GI/Hepatic negative GI ROS, Neg liver ROS,   Endo/Other  negative endocrine ROS  Renal/GU negative Renal ROS  negative genitourinary   Musculoskeletal negative musculoskeletal ROS (+)   Abdominal   Peds negative pediatric ROS (+)  Hematology negative hematology ROS (+)   Anesthesia Other Findings   Reproductive/Obstetrics negative OB ROS                             Anesthesia Physical Anesthesia Plan  ASA: II  Anesthesia Plan: MAC   Post-op Pain Management:    Induction: Intravenous  PONV Risk Score and Plan: Ondansetron  Airway Management Planned: Simple Face Mask  Additional Equipment:   Intra-op Plan:   Post-operative Plan:   Informed Consent: I have reviewed the patients History and Physical, chart, labs and discussed the procedure including the risks, benefits and alternatives for the proposed anesthesia with the patient or authorized representative who has indicated his/her understanding and acceptance.   Dental advisory given  Plan Discussed with: CRNA  Anesthesia Plan Comments:         Anesthesia Quick Evaluation

## 2017-06-03 NOTE — Brief Op Note (Signed)
06/03/2017  9:59 AM  PATIENT:  Avanell Shackleton  61 y.o. female  PRE-OPERATIVE DIAGNOSIS:  Stool guaiac positive  POST-OPERATIVE DIAGNOSIS:  colon polyps  PROCEDURE:  Procedure(s): COLONOSCOPY WITH PROPOFOL (N/A)  SURGEON:  Surgeon(s) and Role:    Ronnette Juniper, MD - Primary  PHYSICIAN ASSISTANT:   ASSISTANTS: Cleda Daub, RN, Jamal Maes  ANESTHESIA:   MAC  EBL:  0 mL   BLOOD ADMINISTERED:none  DRAINS: none   LOCAL MEDICATIONS USED:  NONE  SPECIMEN:  Biopsy / Limited Resection  DISPOSITION OF SPECIMEN:  PATHOLOGY  COUNTS:  YES  TOURNIQUET:  * No tourniquets in log *  DICTATION: .Dragon Dictation  PLAN OF CARE: Discharge to home after PACU  PATIENT DISPOSITION:  PACU - hemodynamically stable.   Delay start of Pharmacological VTE agent (>24hrs) due to surgical blood loss or risk of bleeding: no

## 2017-06-03 NOTE — Anesthesia Postprocedure Evaluation (Signed)
Anesthesia Post Note  [name]  Procedure(s) Performed: COLONOSCOPY WITH PROPOFOL (N/A )     Patient location during evaluation: PACU Anesthesia Type: MAC Level of consciousness: awake and alert Pain management: pain level controlled Vital Signs Assessment: post-procedure vital signs reviewed and stable Respiratory status: spontaneous breathing, nonlabored ventilation, respiratory function stable and patient connected to nasal cannula oxygen Cardiovascular status: stable and blood pressure returned to baseline Postop Assessment: no apparent nausea or vomiting Anesthetic complications: no    Last Vitals:  Vitals:   06/03/17 1019 06/03/17 1020  BP: 118/81   Pulse: 70   Resp: 14   Temp:    SpO2: 100% 100%    Last Pain:  Vitals:   06/03/17 0831  TempSrc: Oral                 Montez Hageman

## 2017-06-03 NOTE — Addendum Note (Signed)
Addendum  created 06/03/17 1325 by Anne Fu, CRNA   Intraprocedure Meds edited

## 2017-06-04 ENCOUNTER — Encounter (HOSPITAL_COMMUNITY): Payer: Self-pay | Admitting: Gastroenterology

## 2017-06-11 ENCOUNTER — Other Ambulatory Visit: Payer: Self-pay | Admitting: Gastroenterology

## 2017-06-13 ENCOUNTER — Other Ambulatory Visit: Payer: Self-pay

## 2017-06-13 ENCOUNTER — Encounter (HOSPITAL_COMMUNITY): Payer: Self-pay | Admitting: Emergency Medicine

## 2017-06-25 ENCOUNTER — Encounter (HOSPITAL_COMMUNITY)
Admission: RE | Disposition: A | Discharge status: Home / Self Care | Payer: Self-pay | Source: Ambulatory Visit | Attending: Gastroenterology

## 2017-06-25 ENCOUNTER — Ambulatory Visit (HOSPITAL_COMMUNITY): Payer: BLUE CROSS/BLUE SHIELD | Admitting: Registered Nurse

## 2017-06-25 ENCOUNTER — Ambulatory Visit (HOSPITAL_COMMUNITY)
Admission: RE | Admit: 2017-06-25 | Discharge: 2017-06-25 | Disposition: A | Discharge status: Home / Self Care | Payer: BLUE CROSS/BLUE SHIELD | Source: Ambulatory Visit | Attending: Gastroenterology | Admitting: Gastroenterology

## 2017-06-25 ENCOUNTER — Other Ambulatory Visit: Payer: Self-pay

## 2017-06-25 ENCOUNTER — Encounter (HOSPITAL_COMMUNITY): Payer: Self-pay

## 2017-06-25 DIAGNOSIS — Z87891 Personal history of nicotine dependence: Secondary | ICD-10-CM | POA: Insufficient documentation

## 2017-06-25 DIAGNOSIS — D123 Benign neoplasm of transverse colon: Secondary | ICD-10-CM | POA: Diagnosis not present

## 2017-06-25 DIAGNOSIS — Z1211 Encounter for screening for malignant neoplasm of colon: Secondary | ICD-10-CM | POA: Diagnosis not present

## 2017-06-25 DIAGNOSIS — Z85038 Personal history of other malignant neoplasm of large intestine: Secondary | ICD-10-CM | POA: Diagnosis not present

## 2017-06-25 DIAGNOSIS — C189 Malignant neoplasm of colon, unspecified: Secondary | ICD-10-CM | POA: Diagnosis present

## 2017-06-25 HISTORY — PX: FLEXIBLE SIGMOIDOSCOPY: SHX5431

## 2017-06-25 SURGERY — SIGMOIDOSCOPY, FLEXIBLE
Anesthesia: Monitor Anesthesia Care

## 2017-06-25 MED ORDER — PROPOFOL 10 MG/ML IV BOLUS
INTRAVENOUS | Status: DC | PRN
  Administered 2017-06-25 (×3): 20 mg via INTRAVENOUS

## 2017-06-25 MED ORDER — PROPOFOL 10 MG/ML IV BOLUS
INTRAVENOUS | Status: AC
  Filled 2017-06-25: qty 20

## 2017-06-25 MED ORDER — SODIUM CHLORIDE 0.9 % IV SOLN: INTRAVENOUS | Status: DC

## 2017-06-25 MED ORDER — PROPOFOL 10 MG/ML IV BOLUS
INTRAVENOUS | Status: AC
  Filled 2017-06-25: qty 40

## 2017-06-25 MED ORDER — LIDOCAINE 2% (20 MG/ML) 5 ML SYRINGE
INTRAMUSCULAR | Status: DC | PRN
  Administered 2017-06-25: 100 mg via INTRAVENOUS

## 2017-06-25 MED ORDER — PROPOFOL 500 MG/50ML IV EMUL
INTRAVENOUS | Status: DC | PRN
  Administered 2017-06-25: 130 ug/kg/min via INTRAVENOUS

## 2017-06-25 MED ORDER — LACTATED RINGERS IV SOLN
INTRAVENOUS | Status: DC
  Administered 2017-06-25: 08:00:00 via INTRAVENOUS

## 2017-06-25 MED ORDER — SPOT INK MARKER SYRINGE KIT
PACK | SUBMUCOSAL | Status: AC
  Filled 2017-06-25: qty 5

## 2017-06-25 NOTE — Brief Op Note (Signed)
06/25/2017  9:04 AM  PATIENT:  Toni Rodriguez  62 y.o. female  PRE-OPERATIVE DIAGNOSIS:  Colon cancer  POST-OPERATIVE DIAGNOSIS:  2 hepatic flexure polyps  PROCEDURE:  Procedure(s): FLEXIBLE SIGMOIDOSCOPY (N/A)  SURGEON:  Surgeon(s) and Role:    * Ronnette Juniper, MD - Primary  PHYSICIAN ASSISTANT:   ASSISTANTS: Tory Emerald, RN, Aneta Mins, Tech  ANESTHESIA:   MAC  EBL:  none   BLOOD ADMINISTERED:none  DRAINS: none   LOCAL MEDICATIONS USED:  NONE  SPECIMEN:  Biopsy / Limited Resection  DISPOSITION OF SPECIMEN:  PATHOLOGY  COUNTS:  YES  TOURNIQUET:  * No tourniquets in log *  DICTATION: .Dragon Dictation  PLAN OF CARE: Discharge to home after PACU  PATIENT DISPOSITION:  PACU - hemodynamically stable.   Delay start of Pharmacological VTE agent (>24hrs) due to surgical blood loss or risk of bleeding: no

## 2017-06-25 NOTE — Discharge Instructions (Signed)

## 2017-06-25 NOTE — Interval H&P Note (Signed)
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 For a colonoscopy to get biopsies from site of polypectomy to rule out invasion at stalk/base. 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 After consideration of risks, benefits and other options for treatment, the patient has consented to  Procedure(s): FLEXIBLE SIGMOIDOSCOPY (N/A) as a surgical intervention .  The patient's history has been reviewed, patient examined, no change in status, stable for surgery.  I have reviewed the patient's chart and labs.  Questions were answered to the patient's satisfaction.     Ronnette Juniper

## 2017-06-25 NOTE — Op Note (Signed)
[facility] Patient Name: Toni Rodriguez Procedure Date: 06/25/2017 MRN: [id number] Attending MD: Ronnette Juniper , MD Date of Birth: 03-12-1956 CSN: [id number] Age: 62 Admit Type: Inpatient Procedure:                Flexible Sigmoidoscopy Indications:              High risk colon cancer surveillance: Personal                            history of colon cancer, 2 tubulovillos adenomas                            noted in colon, 1 with invasive adenocarcinoma and                            other with high grade dysplasia, surveillance to                            ensure complete removal of polyp Providers:                Ronnette Juniper, MD, Elmer Ramp. Tilden Dome, RN, Alan Mulder,                            Technician, Courtney Heys. Armistead, CRNA Referring MD:              Medicines:                Monitored Anesthesia Care Complications:            No immediate complications. Estimated Blood Loss:     Estimated blood loss: none. Procedure:                Pre-Anesthesia Assessment:                           - Prior to the procedure, a History and Physical                            was performed, and patient medications and                            allergies were reviewed. The patient's tolerance of                            previous anesthesia was also reviewed. The risks                            and benefits of the procedure and the sedation                            options and risks were discussed with the patient.                            All questions were answered, and informed consent  was obtained. Prior Anticoagulants: The patient has                            taken no previous anticoagulant or antiplatelet                            agents. ASA Grade Assessment: II - A patient with                            mild systemic disease. After reviewing the risks                            and benefits, the patient was deemed in       satisfactory condition to undergo the procedure.                           After obtaining informed consent, the scope was                            passed under direct vision. The EC-[id number] ([id number])                            scope was introduced through the anus and advanced                            to the the ascending colonThe flexible                            sigmoidoscopy was accomplished without difficulty.                            The patient tolerated the procedure well. The                            quality of the bowel preparation was poor. Scope In: Scope Out: Findings:      The perianal and digital rectal examinations were normal.      Two sessile polyps were found in the hepatic flexure. The polyps were 5       to 10 mm in size. These polyps were removed with a hot snare. Resection       and retrieval were complete.      There was no residual polypoid tissue noted in descending colon or       sigmoid colon. Impression:               - Preparation of the colon was poor.                           - Two 5 to 10 mm polyps at the hepatic flexure,                            removed with a hot snare. Resected and retrieved. Moderate Sedation:      Patient did not receive moderate sedation for this procedure, but       instead received  monitored anesthesia care. Recommendation:           - Discharge patient to home.                           - Await pathology results.                           - Repeat flexible sigmoidoscopy in 6 months for                            surveillance. Procedure Code(s):        --- Professional ---                           [phone], Sigmoidoscopy, flexible; with removal of                            tumor(s), polyp(s), or other lesion(s) by snare                            technique Diagnosis Code(s):        --- Professional ---                           U04.540, Personal history of other malignant                            neoplasm of  large intestine                           D12.3, Benign neoplasm of transverse colon (hepatic                            flexure or splenic flexure) CPT copyright 2016 American [employer] Association. All rights reserved. The codes documented in this report are preliminary and upon coder review may  be revised to meet current compliance requirements. Ronnette Juniper, MD 06/25/2017 9:08:39 AM This report has been signed electronically. Number of Addenda: 0

## 2017-06-25 NOTE — Anesthesia Preprocedure Evaluation (Signed)
Anesthesia Evaluation  Patient identified by MRN, date of birth, ID band Patient awake    Reviewed: Allergy & Precautions, NPO status , Patient's Chart, lab work & pertinent test results  Airway Mallampati: II  TM Distance: >3 FB     Dental   Pulmonary neg pulmonary ROS,    breath sounds clear to auscultation       Cardiovascular negative cardio ROS   Rhythm:Regular Rate:Normal     Neuro/Psych    GI/Hepatic Neg liver ROS, History noted. CG   Endo/Other  negative endocrine ROS  Renal/GU negative Renal ROS     Musculoskeletal   Abdominal   Peds  Hematology   Anesthesia Other Findings   Reproductive/Obstetrics                             Anesthesia Physical Anesthesia Plan  ASA: II  Anesthesia Plan: MAC   Post-op Pain Management:    Induction: Intravenous  PONV Risk Score and Plan: 2 and Treatment may vary due to age or medical condition  Airway Management Planned: Simple Face Mask and Nasal Cannula  Additional Equipment:   Intra-op Plan:   Post-operative Plan:   Informed Consent:   Dental advisory given  Plan Discussed with: CRNA  Anesthesia Plan Comments:         Anesthesia Quick Evaluation

## 2017-06-25 NOTE — Anesthesia Postprocedure Evaluation (Signed)
Anesthesia Post Note  [name]  Procedure(s) Performed: FLEXIBLE SIGMOIDOSCOPY (N/A )     Patient location during evaluation: PACU Anesthesia Type: MAC Level of consciousness: awake Pain management: pain level controlled Vital Signs Assessment: post-procedure vital signs reviewed and stable Respiratory status: spontaneous breathing Cardiovascular status: stable Anesthetic complications: no    Last Vitals:  Vitals:   06/25/17 0925 06/25/17 0930  BP:  115/60  Pulse: (!) 59 76  Resp: 13 (!) 21  Temp: 36.6 C   SpO2: 100% 100%    Last Pain:  Vitals:   06/25/17 0925  TempSrc: Oral                 Clifford Coudriet

## 2017-06-25 NOTE — Transfer of Care (Signed)
Immediate Anesthesia Transfer of Care Note  [name]  Procedure(s) Performed: FLEXIBLE SIGMOIDOSCOPY (N/A )  Patient Location: PACU and Endoscopy Unit  Anesthesia Type:MAC  Level of Consciousness: awake, alert , oriented and patient cooperative  Airway & Oxygen Therapy: Patient Spontanous Breathing and Patient connected to face mask oxygen  Post-op Assessment: Report given to RN, Post -op Vital signs reviewed and stable and Patient moving all extremities  Post vital signs: Reviewed and stable  Last Vitals:  Vitals:   06/25/17 0759  BP: 112/62  Pulse: 76  Resp: 11  Temp: 36.7 C  SpO2: 98%    Last Pain:  Vitals:   06/25/17 0759  TempSrc: Oral         Complications: No apparent anesthesia complications

## 2017-06-25 NOTE — Op Note (Signed)
Patient noted to have invasive adenocarcinoma arising from a tubulovillous adenoma in descending colon, another tubulovillous adenoma with high-grade dysplasia noted in sigmoid. Sigmoidoscopy planned to ensure complete removal of polyp.  The prep was poor. I was able to advance the scope up to ascending colon. 2 polyps were noted in the hepatic flexure behind a fold. Both were removed with snare polypectomy and sent to pathology. After further lavage there were no areas in the descending or sigmoid suspicious for remaining polypoid tissue. Retroflexion was unremarkable.  Recommendation: Await pathology. Repeat colonoscopy for surveillance in 6 months.  Ronnette Juniper, M.D.

## 2017-06-27 ENCOUNTER — Encounter (HOSPITAL_COMMUNITY): Payer: Self-pay | Admitting: Gastroenterology

## 2017-08-18 IMAGING — DX DG CHEST 2V
2 series · 2 of 2 positions shown · non-contrast
Comparison: 09/08/2016

CLINICAL DATA: Right chest and back pain, shortness of breath, and
nonproductive cough. Recent pneumonia.

EXAM:
CHEST  2 VIEW

[chest pa]
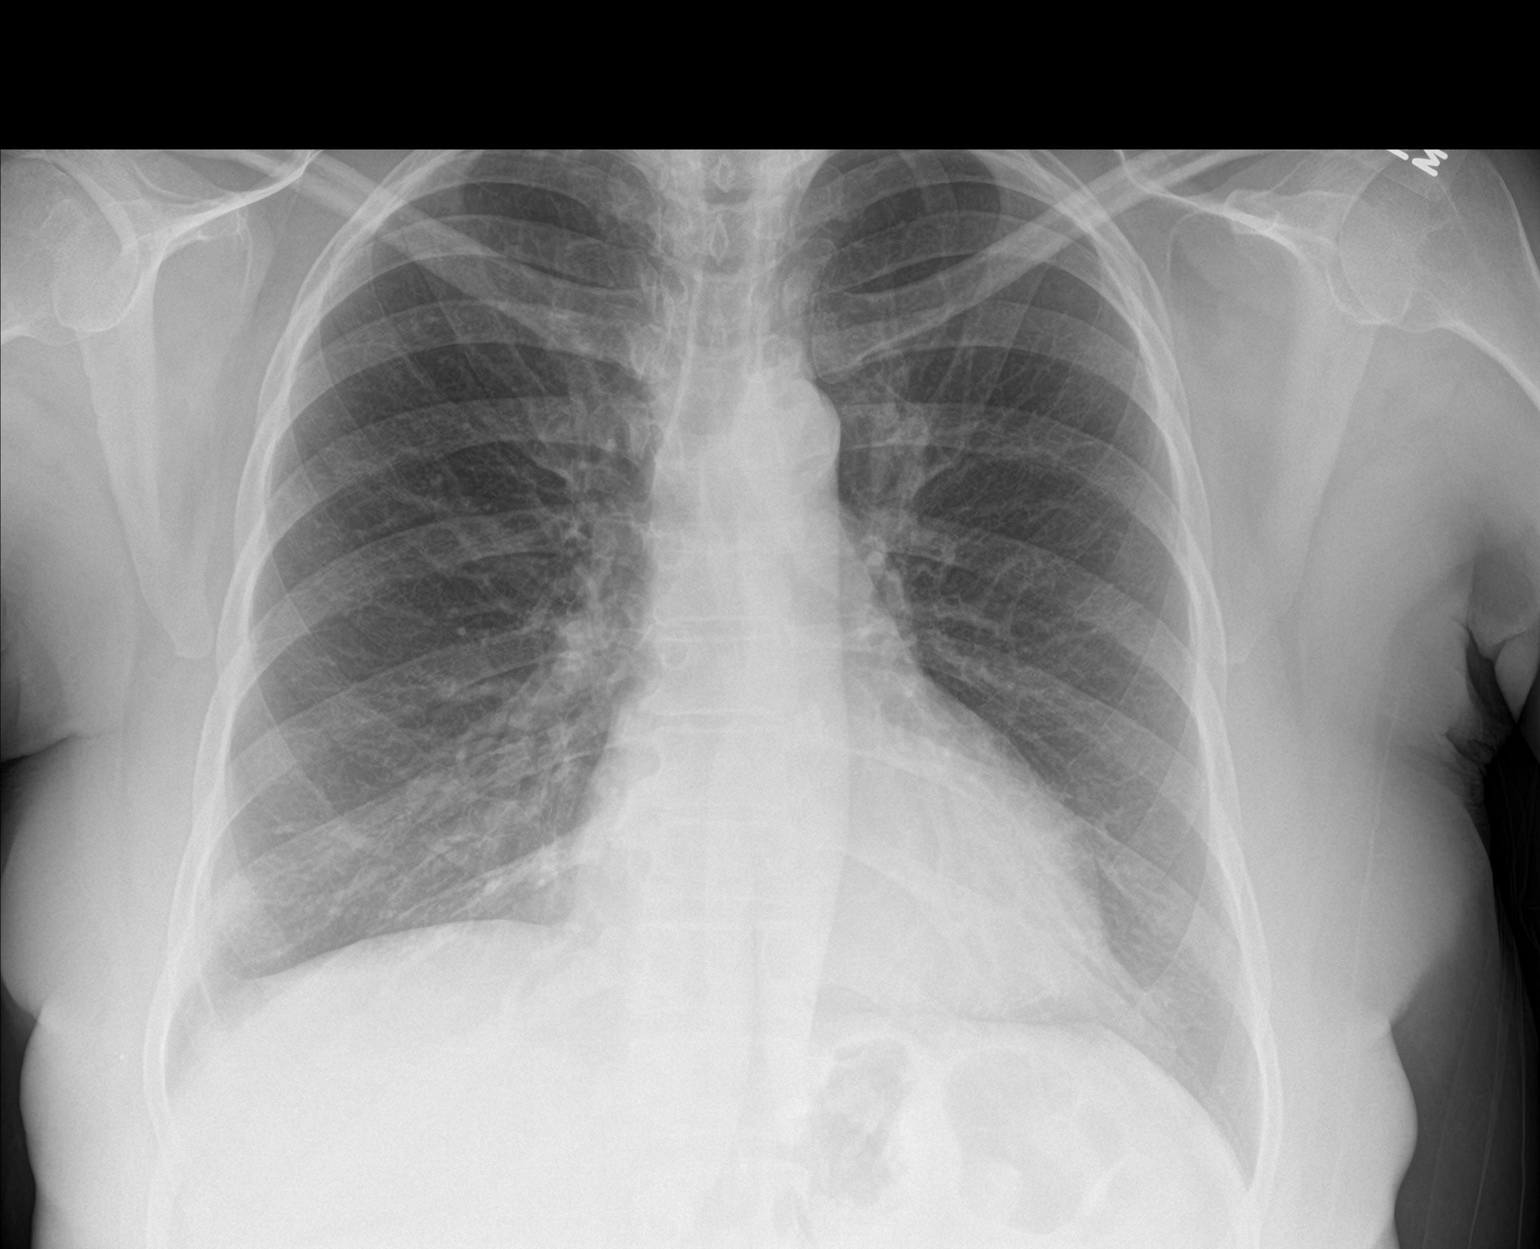

[chest lat]
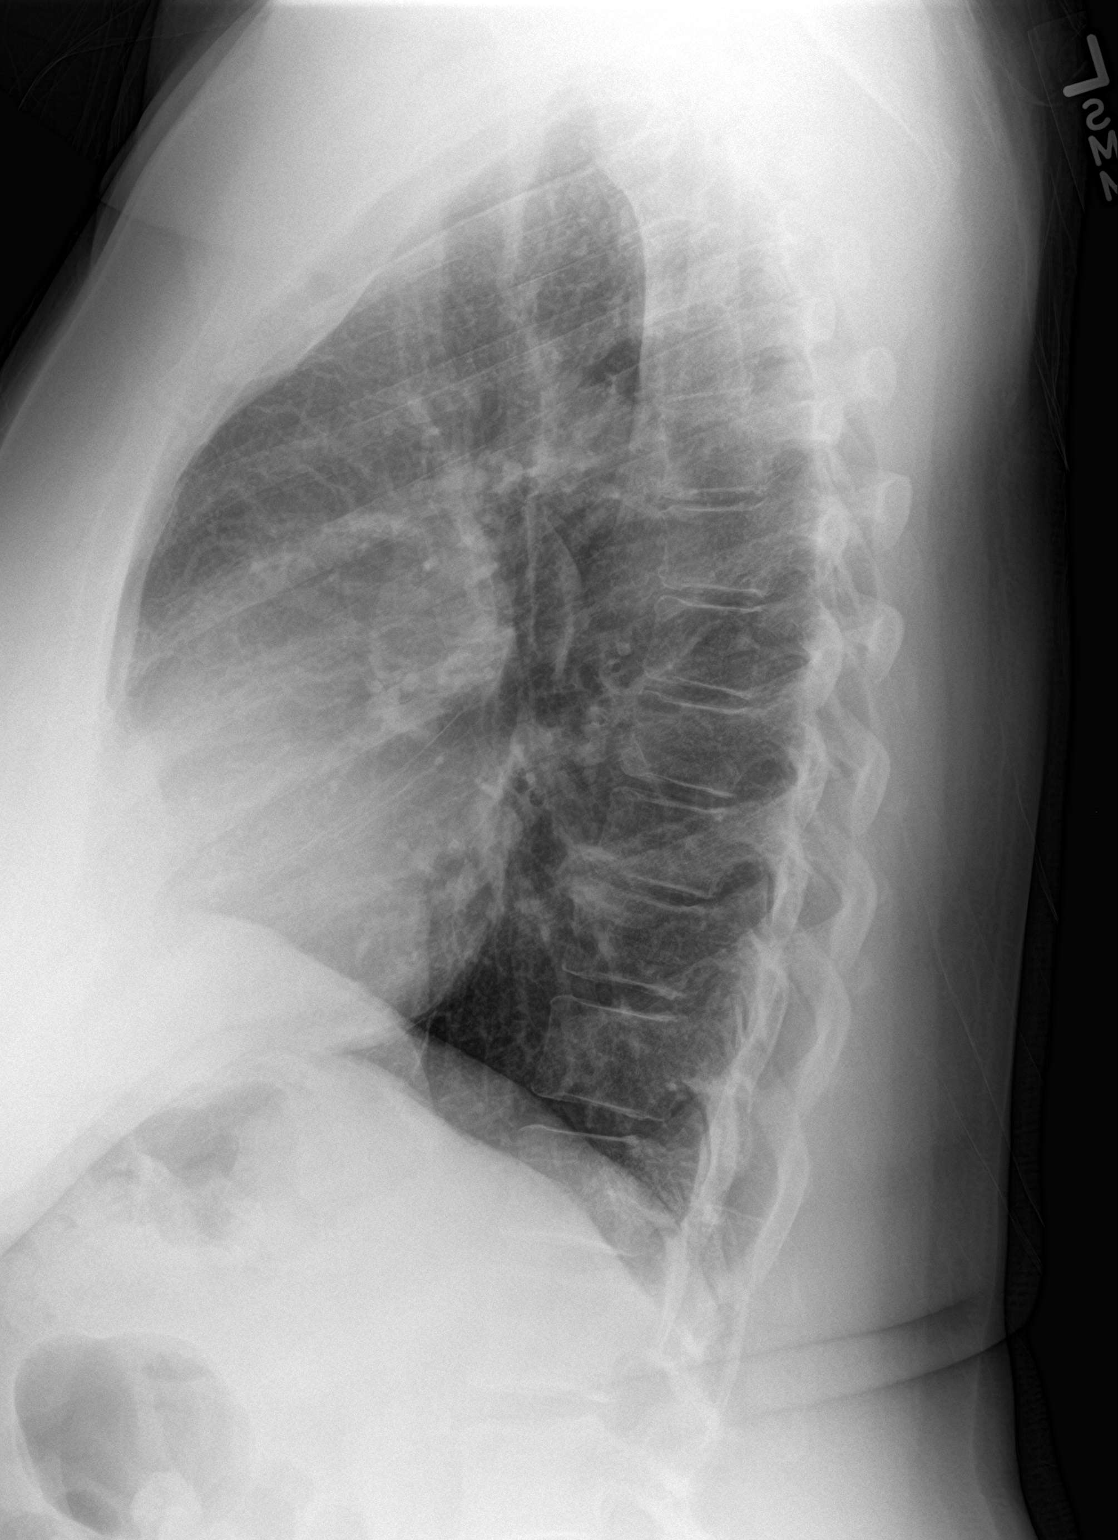

[2 of 2 positions shown; findings below may reference images not displayed]

FINDINGS: The heart size and mediastinal contours are within normal limits.
Mild bibasilar scarring noted. No evidence of pulmonary infiltrate
or edema. No evidence of pleural effusion or pneumothorax. The
visualized skeletal structures are unremarkable.
IMPRESSION: No active cardiopulmonary disease.

## 2018-11-28 ENCOUNTER — Telehealth (HOSPITAL_COMMUNITY): Payer: Self-pay

## 2018-11-28 NOTE — Telephone Encounter (Signed)
Telephoned patient at home.  Left message with BCCCP number to return call and schedule appointment.

## 2018-12-01 ENCOUNTER — Other Ambulatory Visit: Payer: Self-pay | Admitting: *Deleted

## 2018-12-01 DIAGNOSIS — N6489 Other specified disorders of breast: Secondary | ICD-10-CM

## 2018-12-09 ENCOUNTER — Ambulatory Visit (HOSPITAL_COMMUNITY)
Admission: RE | Admit: 2018-12-09 | Discharge: 2018-12-09 | Disposition: A | Discharge status: Home / Self Care | Payer: No Typology Code available for payment source | Source: Ambulatory Visit | Attending: Obstetrics and Gynecology | Admitting: Obstetrics and Gynecology

## 2018-12-09 ENCOUNTER — Ambulatory Visit: Payer: No Typology Code available for payment source

## 2018-12-09 ENCOUNTER — Ambulatory Visit
Admission: RE | Admit: 2018-12-09 | Discharge: 2018-12-09 | Disposition: A | Discharge status: Home / Self Care | Payer: No Typology Code available for payment source | Source: Ambulatory Visit | Attending: Obstetrics and Gynecology | Admitting: Obstetrics and Gynecology

## 2018-12-09 ENCOUNTER — Other Ambulatory Visit: Payer: Self-pay

## 2018-12-09 ENCOUNTER — Encounter (HOSPITAL_COMMUNITY): Payer: Self-pay

## 2018-12-09 DIAGNOSIS — N6489 Other specified disorders of breast: Secondary | ICD-10-CM

## 2018-12-09 DIAGNOSIS — Z1239 Encounter for other screening for malignant neoplasm of breast: Secondary | ICD-10-CM

## 2018-12-09 DIAGNOSIS — N644 Mastodynia: Secondary | ICD-10-CM | POA: Insufficient documentation

## 2018-12-09 DIAGNOSIS — Z0000 Encounter for general adult medical examination without abnormal findings: Secondary | ICD-10-CM | POA: Insufficient documentation

## 2018-12-09 NOTE — Progress Notes (Signed)
Complaints of left breast pain x 2 years that comes and goes. Patient rates the pain at a 5-6 out of 10.  Pap Smear: Pap smear not completed today.Last Pap smear was 18 years ago and normal per patient. Per patient has a history of an abnormal Pap smear 28 years ago that a colposcopy was completed for follow up and all Pap smears were normal after colposcopy. Patient has a history of a hysterectomy for DUB 22 years. Patient no longer needs Pap smears due to her history of a hysterectomy for benign reasons per BCCCP and ACOG guidelines. No pap smear results in EPIC.  Physical exam: Breasts Breasts symmetrical. No skin abnormalities bilateral breasts. No nipple retraction bilateral breasts. No nipple discharge bilateral breasts. No lymphadenopathy. No lumps palpated bilateral breasts. Complaints of left inner breast tenderness on exam. Referred patient to the [facility] for a diagnostic mammogram and possible left breast ultrasound. Appointment scheduled for Tuesday, December 09, 2018 at 1250.        Pelvic/Bimanual No Pap smear completed today since patient has a history of a hysterectomy for benign reasons. Pap smear not indicated per BCCCP guidelines.  Smoking History: Patient has never smoked.  Patient Navigation: Patient education provided. Access to services provided for patient through [facility] program.   Colorectal Cancer Screening: Patient had a colonoscopy completed 06/03/2017. No complaints today.   Breast and Cervical Cancer Risk Assessment: Patient has no family history of breast cancer, known genetic mutations, or radiation treatment to the chest before age 66. Per patient has a history of cervical dysplasia. Patient has no history of being immunocompromised or DES exposure in-utero.  Risk Assessment    Risk Scores      12/09/2018   Last edited by: Armond Hang, LPN   5-year risk: 1.2 %   Lifetime risk: 5.5 %

## 2018-12-09 NOTE — Patient Instructions (Addendum)
Explained breast self awareness with Avanell Shackleton. Patient did not need a Pap smear today due to patient has a history of a hysterectomy for benign reasons. Let patient know that she doesn't need any further Pap smears due to her history of a hysterectomy for benign reasons. Referred patient to the [facility] for a diagnostic mammogram and possible left breast ultrasound. Appointment scheduled for Tuesday, December 09, 2018 at 1250. Avanell Shackleton verbalized understanding.  Garnet Overfield, Arvil Chaco, RN 11:04 AM

## 2018-12-11 ENCOUNTER — Encounter (HOSPITAL_COMMUNITY): Payer: Self-pay | Admitting: *Deleted

## 2018-12-22 ENCOUNTER — Other Ambulatory Visit: Payer: Self-pay

## 2018-12-22 ENCOUNTER — Inpatient Hospital Stay: Payer: Self-pay | Attending: Obstetrics and Gynecology | Admitting: *Deleted

## 2018-12-22 VITALS — BP 110/72 | Temp 96.8°F | Ht 71.0 in | Wt 230.0 lb

## 2018-12-22 DIAGNOSIS — Z0000 Encounter for general adult medical examination without abnormal findings: Secondary | ICD-10-CM

## 2018-12-22 NOTE — Progress Notes (Signed)
[facility] initial screening     Clinical Measurement:  Height: 71 in Weight: 230 lb  Blood Pressure: 120/82  Blood Pressure #2: 110/72 Fasting Labs Drawn Today, will review with patient when they result.   Medical History:  Patient states that she does not have a history of high cholesterol, blood pressure or diabetes.  Medications: Patient states that she does not take any medications to lower cholesterol, blood pressure or blood sugar. She does not take an aspirin a day to prevent heart attack or stroke.   Blood pressure, self measurement: Patient states that she does not measure blood pressure at home and has never been told to measure blood pressure.    Nutrition:  Patient states that on an average day she consumes 0 cups pf fruit and 3 cups of vegetables. Patient does not eat fish a least 2 times a week. In a typical day patient states that about half of the grain products she consumes are whole grains. Patient does not drink less than 36 ounces of beverages with added sugars weekly. Patient is currently watching or reducing sodium intake. In the past 7 days patient has not had an alcoholic beverage on any days. Patient does not consume alcohol.   Physical activity: Patient states that she gets 140 minutes of moderate physical activity and 0 minutes of vigorous physical activity each week.  Smoking status: Patient states that she has quit smoking over 12 months ago.     Quality of life: Over the past 2 weeks patient states that she has not had any days where she has had little interest or pleasure in doing things and 0 days where she has felt down, depressed or hopeless.   Risk reduction and counseling: Patient states that she would like to work on her diet to loose weight. Spoke with the patient about adding fruits to diet. She currently is not eating any fruits, I encouraged her to try and eat 2 servings of fruit each day. Also spoke with patient about portion control. Used food models to  show examples of what 1 servings of different foods look like. We also spoke about reducing the amount of sodas she drinks. On average she drinks at least 2 sodas a day.    Navigation:  I will notify patient of lab results.  Patient is aware of 2 more health coaching sessions and a follow up.  Time: 35 minutes

## 2018-12-23 LAB — LIPID PANEL W/O CHOL/HDL RATIO
Cholesterol, Total: 163 mg/dL (ref 100–199)
HDL: 52 mg/dL (ref >39)
LDL Calculated: 100 mg/dL — ABNORMAL HIGH (ref 0–99)
Triglycerides: 57 mg/dL (ref 0–149)
VLDL Cholesterol Cal: 11 mg/dL (ref 5–40)

## 2018-12-23 LAB — GLUCOSE, RANDOM: Glucose: 104 mg/dL — ABNORMAL HIGH (ref 65–99)

## 2018-12-23 LAB — HGB A1C W/O EAG: Hgb A1c MFr Bld: 6 % — ABNORMAL HIGH (ref 4.8–5.6)

## 2018-12-25 ENCOUNTER — Telehealth: Payer: Self-pay

## 2018-12-25 NOTE — Telephone Encounter (Signed)
Health coaching 2     Labs-163 cholesterol , 100 LDL cholesterol , 57 triglycerides , 52 HDL cholesterol , 6.0 hemoglobin A1C , 104 mean plasma glucose  Patient understands and is aware of her lab results.   Goals-  Spoke with patient about lab results. Answered any questions that patient had. Goal for patient is to reduce/eliminate soda intake. Patient currently drinks several sodas a day. Spoke with patient about reducing the amount of sweets she eats. Also educated patient about the importance of watching carbohydrate intake.  Encouraged patient to continue monitoring portion sizes. Patient stated that she is trying to increase her fresh fruit and vegetable intake. Encouraged patient to continue walking for 20-30 minutes each day.  Navigation:  Patient is aware of 1 more health coaching sessions and a follow up. Patient is scheduled for follow-up for elevated hemoglobin and glucose on July 29,2020 @ 9:15 am at [facility].   Time-  12 minuutes

## 2018-12-31 ENCOUNTER — Other Ambulatory Visit: Payer: Self-pay

## 2018-12-31 ENCOUNTER — Encounter: Payer: Self-pay | Admitting: Internal Medicine

## 2018-12-31 ENCOUNTER — Ambulatory Visit (INDEPENDENT_AMBULATORY_CARE_PROVIDER_SITE_OTHER): Payer: Self-pay | Admitting: Internal Medicine

## 2018-12-31 DIAGNOSIS — Z0000 Encounter for general adult medical examination without abnormal findings: Secondary | ICD-10-CM

## 2018-12-31 DIAGNOSIS — Z792 Long term (current) use of antibiotics: Secondary | ICD-10-CM

## 2018-12-31 DIAGNOSIS — Z90710 Acquired absence of both cervix and uterus: Secondary | ICD-10-CM

## 2018-12-31 DIAGNOSIS — R7303 Prediabetes: Secondary | ICD-10-CM

## 2018-12-31 NOTE — Patient Instructions (Signed)
It was a pleasure meeting you today!  Your A1c is 6.0.  This is considered to be in the prediabetes range.  This can typically be helped with lifestyle changes such as diet and exercise.  It sounds like you have a great plan already.  If you find that you are struggling with diet, her clinic does have a dietitian who could certainly help you.  The lifestyle changes also help with your cholesterol.  Please remember to schedule your colonoscopy follow-up.  If our clinic can assist you with this in any way, please let us know.

## 2018-12-31 NOTE — Assessment & Plan Note (Signed)
A1c from 12/2018 is 6.0.  Discussed with patient that this is considered prediabetes and encouraged lifestyle changes including diet and exercise.  Resources offered to the patient to help with these things.  Plan: Repeat A1c in 6 months.  Encouraged lifestyle changes to avoid moving to the diabetic stage.

## 2018-12-31 NOTE — Progress Notes (Signed)
   CC: prediabetes  HPI:  [name] is a 63 y.o. female with no significant past medical history.  Patient presents to clinic today for Cable visit.  Her recent labs including A1c and cholesterol were discussed.  Lifestyle changes were discussed. Patient notes that she has been taking doxycycline for a tooth problem.  This is being managed by her dentist.  Past Medical History:  Diagnosis Date  . UTI (lower urinary tract infection)    Review of Systems:  negative other than those stated in HPI  Physical Exam:  There were no vitals filed for this visit.  GENERAL: well appearing, in no apparent distress CARDIAC: heart regular rate and rhythm, no peripheral edema appreciated PULMONARY: lung sounds clear to auscultation SKIN: no rash or lesion on limited exam    Assessment & Plan:   See Encounters Tab for problem based charting.  Pertinent labs & imaging results that were available during my care of the patient were reviewed by me and considered in my medical decision making  Patient is in agreement with the plan and endorses no further questions at this time.  The 10-year ASCVD risk score Mikey Bussing DC Brooke Bonito., et al., 2013) is: 3.2%   Values used to calculate the score:     Age: 69 years     Sex: Female     Is Non-Hispanic African American: Yes     Diabetic: No     Tobacco smoker: No     Systolic Blood Pressure: 381 mmHg     Is BP treated: No     HDL Cholesterol: 52 mg/dL     Total Cholesterol: 163 mg/dL  Patient seen with Dr. Adolm Joseph, MD Internal Medicine Resident-PGY1 12/31/18

## 2018-12-31 NOTE — Assessment & Plan Note (Signed)
Patient recently had mammogram and saw surgery for a nonchanging lesion.  Surgery did not have any recommendations at that time.  Patient is due for repeat colonoscopy.  Patient notes that she will be getting this set up soon.  Patient reports having a hysterectomy at which time they also took her cervix.  Pap smear therefore would not be indicated.

## 2019-01-02 NOTE — Progress Notes (Signed)
Internal Medicine Clinic Attending  I saw and evaluated the patient.  I personally confirmed the key portions of the history and exam documented by Dr. Christian   and I reviewed pertinent patient test results.  The assessment, diagnosis, and plan were formulated together and I agree with the documentation in the resident's note.  

## 2019-01-07 ENCOUNTER — Telehealth: Payer: Self-pay

## 2019-01-07 NOTE — Telephone Encounter (Signed)
Returned patient's phone call. Patient stated that she received a bill from her follow-up visit with Internal Medicine. Told patient that she would need to give [place] a copy of the bill for [place] to get it taken care of. Patient voiced understanding.

## 2019-04-27 ENCOUNTER — Telehealth: Payer: Self-pay

## 2019-04-27 NOTE — Telephone Encounter (Signed)
Left message for patient about conducting last health coaching session for the [facility] program. Left name and number for patient to call back.

## 2020-10-02 ENCOUNTER — Other Ambulatory Visit: Payer: Self-pay

## 2020-10-02 ENCOUNTER — Encounter (HOSPITAL_COMMUNITY): Payer: Self-pay

## 2020-10-02 ENCOUNTER — Telehealth (HOSPITAL_COMMUNITY): Payer: Self-pay

## 2020-10-02 ENCOUNTER — Ambulatory Visit (HOSPITAL_COMMUNITY)
Admission: EM | Admit: 2020-10-02 | Discharge: 2020-10-02 | Disposition: A | Discharge status: Home / Self Care | Payer: 59 | Attending: Urgent Care | Admitting: Urgent Care

## 2020-10-02 DIAGNOSIS — K529 Noninfective gastroenteritis and colitis, unspecified: Secondary | ICD-10-CM | POA: Diagnosis not present

## 2020-10-02 DIAGNOSIS — R197 Diarrhea, unspecified: Secondary | ICD-10-CM | POA: Diagnosis present

## 2020-10-02 DIAGNOSIS — R110 Nausea: Secondary | ICD-10-CM | POA: Diagnosis present

## 2020-10-02 MED ORDER — LOPERAMIDE HCL 2 MG PO CAPS: 4.0000 mg | ORAL_CAPSULE | Freq: Two times a day (BID) | ORAL | 0 refills | Status: DC | PRN

## 2020-10-02 MED ORDER — ONDANSETRON 8 MG PO TBDP: 8.0000 mg | ORAL_TABLET | Freq: Three times a day (TID) | ORAL | 0 refills | Status: DC | PRN

## 2020-10-02 NOTE — ED Triage Notes (Signed)
Pt present diarrhea and nausea, symptom started a week ago. Pt states that she has been having diarrhea for over a week. She cannot keep anything in her stomach

## 2020-10-02 NOTE — Discharge Instructions (Signed)

## 2020-10-02 NOTE — ED Provider Notes (Signed)
[facility]   MRN: [id number] DOB: 11-15-1955  Subjective:   Toni Rodriguez is a 65 y.o. female presenting for 6-day history of persistent nausea without vomiting, diarrhea.  Patient states that initially her symptoms were severe, had 5-10 bouts of diarrhea a day.  Yesterday she was able to tolerate some foods including rice, applesauce, drank sips of water.  Reports that she has had Imodium with some relief.  She did complete a course of clindamycin about 10 days ago and her symptoms started shortly thereafter.  Denies history of GI issues including Crohn's, ulcerative colitis, IBS.  Denies recent long distance travel.  No bloody stools.  No chest pain, shortness of breath.  Reports that today she is actually had 4 bowel movements, was very liquidy and watery.  Still no blood in  No current facility-administered medications for this encounter.  Current Outpatient Medications:  .  Cyanocobalamin (VITAMIN B-12 PO), Take 1 tablet by mouth daily., Disp: , Rfl:  .  doxycycline (ADOXA) 100 MG tablet, Take 100 mg by mouth 2 (two) times daily., Disp: , Rfl:  .  ibuprofen (ADVIL,MOTRIN) 800 MG tablet, Take 200 mg by mouth every 8 (eight) hours as needed for headache or moderate pain., Disp: , Rfl:  .  Liniments (SALONPAS PAIN RELIEF PATCH EX), Apply 1 patch topically daily as needed (for pain)., Disp: , Rfl:  .  oxybutynin (DITROPAN) 5 MG tablet, Take 1 tablet (5 mg total) by mouth 3 (three) times daily., Disp: 90 tablet, Rfl: 0   Allergies  Allergen Reactions  . Penicillins Hives and Other (See Comments)    Has patient had a PCN reaction causing immediate rash, facial/tongue/throat swelling, SOB or lightheadedness with hypotension: Yes Has patient had a PCN reaction causing severe rash involving mucus membranes or skin necrosis: No Has patient had a PCN reaction that required hospitalization: No Has patient had a PCN reaction occurring within the last 10 years: No If all of the  above answers are "NO", then may proceed with Cephalosporin use.     Past Medical History:  Diagnosis Date  . UTI (lower urinary tract infection)      Past Surgical History:  Procedure Laterality Date  . ABDOMINAL HYSTERECTOMY    . BREAST BIOPSY Left 10/2015  . BREAST BIOPSY Right 10/2015   benign  . COLONOSCOPY WITH PROPOFOL N/A 06/03/2017   Procedure: COLONOSCOPY WITH PROPOFOL;  Surgeon: Ronnette Juniper, MD;  Location: [facility];  Service: Gastroenterology;  Laterality: N/A;  . FLEXIBLE SIGMOIDOSCOPY N/A 06/25/2017   Procedure: FLEXIBLE SIGMOIDOSCOPY;  Surgeon: Ronnette Juniper, MD;  Location: [facility];  Service: Gastroenterology;  Laterality: N/A;  . TONSILLECTOMY      Family History  Problem Relation Age of Onset  . Diabetes Mother   . Hypertension Mother   . Diabetes Father   . Hypertension Father   . Diabetes Maternal Grandmother   . Diabetes Maternal Grandfather   . Hyperlipidemia Maternal Grandfather   . Diabetes Paternal Grandmother   . Diabetes Paternal Grandfather     Social History   Tobacco Use  . Smoking status: Never Smoker  . Smokeless tobacco: Never Used  Vaping Use  . Vaping Use: Never used  Substance Use Topics  . Alcohol use: No  . Drug use: No    ROS   Objective:   Vitals: BP 117/76 (BP Location: Right Arm)   Pulse (!) 108   Temp 98.9 F (37.2 C) (Oral)   Resp 16  SpO2 98%   Physical Exam Constitutional:      General: She is not in acute distress.    Appearance: Normal appearance. She is well-developed. She is not ill-appearing, toxic-appearing or diaphoretic.  HENT:     Head: Normocephalic and atraumatic.     Nose: Nose normal.     Mouth/Throat:     Mouth: Mucous membranes are moist.     Pharynx: Oropharynx is clear.  Eyes:     General: No scleral icterus.       Right eye: No discharge.        Left eye: No discharge.     Extraocular Movements: Extraocular movements intact.     Conjunctiva/sclera: Conjunctivae normal.      Pupils: Pupils are equal, round, and reactive to light.  Cardiovascular:     Rate and Rhythm: Normal rate.  Pulmonary:     Effort: Pulmonary effort is normal.  Abdominal:     General: Bowel sounds are normal. There is no distension.     Palpations: Abdomen is soft. There is no mass.     Tenderness: There is abdominal tenderness (mild, lower). There is no right CVA tenderness, left CVA tenderness, guarding or rebound.  Skin:    General: Skin is warm and dry.  Neurological:     General: No focal deficit present.     Mental Status: She is alert and oriented to person, place, and time.     Motor: No weakness.     Coordination: Coordination normal.     Gait: Gait normal.     Deep Tendon Reflexes: Reflexes normal.  Psychiatric:        Mood and Affect: Mood normal.        Behavior: Behavior normal.        Thought Content: Thought content normal.        Judgment: Judgment normal.     Assessment and Plan :   PDMP not reviewed this encounter.  1. Colitis   2. Diarrhea, unspecified type   3. Nausea     GI panel, C. difficile screen pending.  Will manage for suspected colitis with supportive care.  Recommended patient hydrate well, eat light meals and maintain electrolytes.  Will use Zofran and Imodium for nausea, vomiting and diarrhea. Counseled patient on potential for adverse effects with medications prescribed/recommended today, ER and return-to-clinic precautions discussed, patient verbalized understanding.    Jaynee Eagles, [place] 10/02/20 1458

## 2020-10-03 LAB — C DIFFICILE QUICK SCREEN W PCR REFLEX
C Diff antigen: POSITIVE — AB
C Diff interpretation: DETECTED
C Diff toxin: POSITIVE — AB

## 2020-10-03 MED ORDER — METRONIDAZOLE 500 MG PO TABS: 500.0000 mg | ORAL_TABLET | Freq: Three times a day (TID) | ORAL | 0 refills | Status: AC

## 2020-10-03 MED ORDER — VANCOMYCIN HCL 125 MG PO CAPS: 125.0000 mg | ORAL_CAPSULE | Freq: Four times a day (QID) | ORAL | 0 refills | Status: AC

## 2020-10-03 NOTE — ED Notes (Signed)
This RN received critical lab result for patient and report to Dr. Lanny Cramp

## 2020-10-03 NOTE — Telephone Encounter (Signed)
C.Diff toxin is positive. Vancomycin prescription sent to pharmacy on file.

## 2020-10-04 LAB — GASTROINTESTINAL PANEL BY PCR, STOOL (REPLACES STOOL CULTURE)

## 2020-10-06 ENCOUNTER — Non-Acute Institutional Stay (HOSPITAL_COMMUNITY)
Admission: RE | Admit: 2020-10-06 | Discharge: 2020-10-06 | Disposition: A | Discharge status: Home / Self Care | Payer: 59 | Source: Ambulatory Visit | Attending: Internal Medicine | Admitting: Internal Medicine

## 2020-10-06 ENCOUNTER — Other Ambulatory Visit: Payer: Self-pay

## 2020-10-06 DIAGNOSIS — B9689 Other specified bacterial agents as the cause of diseases classified elsewhere: Secondary | ICD-10-CM | POA: Diagnosis not present

## 2020-10-06 DIAGNOSIS — E860 Dehydration: Secondary | ICD-10-CM | POA: Insufficient documentation

## 2020-10-06 MED ORDER — DEXTROSE-NACL 5-0.9 % IV SOLN: INTRAVENOUS | Status: DC

## 2020-10-06 NOTE — Progress Notes (Addendum)
PATIENT CARE CENTER NOTE  Diagnosis: Dehydration E86.0   Provider: Willey Blade, NP   Procedure: IV hydration    Note: Patient received 1 liter bolus of D5NS via PIV. Rate originally ordered at 125 ml/hr. Per patient request, provider notified and rate increased to 500 ml/hr. Patient tolerated infusion well with no adverse reaction. Vital signs stable. Discharge instructions given. Patient alert, oriented and ambulatory at discharge.

## 2020-10-06 NOTE — Discharge Instructions (Signed)
Rehydration, Adult Rehydration is the replacement of body fluids, salts, and minerals (electrolytes) that are lost during dehydration. Dehydration is when there is not enough water or other fluids in the body. This happens when you lose more fluids than you take in. Common causes of dehydration include:  Not drinking enough fluids. This can occur when you are ill or doing activities that require a lot of energy, especially in hot weather.  Conditions that cause loss of water or other fluids, such as diarrhea, vomiting, sweating, or urinating a lot.  Other illnesses, such as fever or infection.  Certain medicines, such as those that remove excess fluid from the body (diuretics). Symptoms of mild or moderate dehydration may include thirst, dry lips and mouth, and dizziness. Symptoms of severe dehydration may include increased heart rate, confusion, fainting, and not urinating. For severe dehydration, you may need to get fluids through an IV at the hospital. For mild or moderate dehydration, you can usually rehydrate at home by drinking certain fluids as told by your health care provider. What are the risks? Generally, rehydration is safe. However, taking in too much fluid (overhydration) can be a problem. This is rare. Overhydration can cause an electrolyte imbalance, kidney failure, or a decrease in salt (sodium) levels in the body. Supplies needed You will need an oral rehydration solution (ORS) if your health care provider tells you to use one. This is a drink to treat dehydration. It can be found in pharmacies and retail stores. How to rehydrate Fluids Follow instructions from your health care provider for rehydration. The kind of fluid and the amount you should drink depend on your condition. In general, you should choose drinks that you prefer.  If told by your health care provider, drink an ORS. ? Make an ORS by following instructions on the package. ? Start by drinking small amounts,  about  cup (120 mL) every 5-10 minutes. ? Slowly increase how much you drink until you have taken the amount recommended by your health care provider.  Drink enough clear fluids to keep your urine pale yellow. If you were told to drink an ORS, finish it first, then start slowly drinking other clear fluids. Drink fluids such as: ? Water. This includes sparkling water and flavored water. Drinking only water can lead to having too little sodium in your body (hyponatremia). Follow the advice of your health care provider. ? Water from ice chips you suck on. ? Fruit juice with water you add to it (diluted). ? Sports drinks. ? Hot or cold herbal teas. ? Broth-based soups. ? Milk or milk products. Food Follow instructions from your health care provider about what to eat while you rehydrate. Your health care provider may recommend that you slowly begin eating regular foods in small amounts.  Eat foods that contain a healthy balance of electrolytes, such as bananas, oranges, potatoes, tomatoes, and spinach.  Avoid foods that are greasy or contain a lot of sugar. In some cases, you may get nutrition through a feeding tube that is passed through your nose and into your stomach (nasogastric tube, or NG tube). This may be done if you have uncontrolled vomiting or diarrhea.   Beverages to avoid Certain beverages may make dehydration worse. While you rehydrate, avoid drinking alcohol.   How to tell if you are recovering from dehydration You may be recovering from dehydration if:  You are urinating more often than before you started rehydrating.  Your urine is pale yellow.  Your energy level   improves.  You vomit less frequently.  You have diarrhea less frequently.  Your appetite improves or returns to normal.  You feel less dizzy or less light-headed.  Your skin tone and color start to look more normal. Follow these instructions at home:  Take over-the-counter and prescription medicines only  as told by your health care provider.  Do not take sodium tablets. Doing this can lead to having too much sodium in your body (hypernatremia). Contact a health care provider if:  You continue to have symptoms of mild or moderate dehydration, such as: ? Thirst. ? Dry lips. ? Slightly dry mouth. ? Dizziness. ? Dark urine or less urine than normal. ? Muscle cramps.  You continue to vomit or have diarrhea. Get help right away if you:  Have symptoms of dehydration that get worse.  Have a fever.  Have a severe headache.  Have been vomiting and the following happens: ? Your vomiting gets worse or does not go away. ? Your vomit includes blood or green matter (bile). ? You cannot eat or drink without vomiting.  Have problems with urination or bowel movements, such as: ? Diarrhea that gets worse or does not go away. ? Blood in your stool (feces). This may cause stool to look black and tarry. ? Not urinating, or urinating only a small amount of very dark urine, within 6-8 hours.  Have trouble breathing.  Have symptoms that get worse with treatment. These symptoms may represent a serious problem that is an emergency. Do not wait to see if the symptoms will go away. Get medical help right away. Call your local emergency services (911 in the [place] Do not drive yourself to the hospital. Summary  Rehydration is the replacement of body fluids and minerals (electrolytes) that are lost during dehydration.  Follow instructions from your health care provider for rehydration. The kind of fluid and amount you should drink depend on your condition.  Slowly increase how much you drink until you have taken the amount recommended by your health care provider.  Contact your health care provider if you continue to show signs of mild or moderate dehydration. This information is not intended to replace advice given to you by your health care provider. Make sure you discuss any questions you have with  your health care provider. Document Revised: 07/22/2019 Document Reviewed: 06/01/2019 Elsevier Patient Education  2021 [employer]  

## 2020-12-07 ENCOUNTER — Ambulatory Visit (INDEPENDENT_AMBULATORY_CARE_PROVIDER_SITE_OTHER): Payer: 59

## 2020-12-07 ENCOUNTER — Other Ambulatory Visit: Payer: Self-pay

## 2020-12-07 ENCOUNTER — Ambulatory Visit (HOSPITAL_COMMUNITY)
Admission: EM | Admit: 2020-12-07 | Discharge: 2020-12-07 | Disposition: A | Discharge status: Home / Self Care | Payer: 59 | Attending: Physician Assistant | Admitting: Physician Assistant

## 2020-12-07 ENCOUNTER — Encounter (HOSPITAL_COMMUNITY): Payer: Self-pay | Admitting: *Deleted

## 2020-12-07 DIAGNOSIS — M545 Low back pain, unspecified: Secondary | ICD-10-CM | POA: Diagnosis not present

## 2020-12-07 DIAGNOSIS — R0789 Other chest pain: Secondary | ICD-10-CM | POA: Diagnosis not present

## 2020-12-07 DIAGNOSIS — S20211A Contusion of right front wall of thorax, initial encounter: Secondary | ICD-10-CM | POA: Diagnosis not present

## 2020-12-07 DIAGNOSIS — S161XXA Strain of muscle, fascia and tendon at neck level, initial encounter: Secondary | ICD-10-CM | POA: Diagnosis not present

## 2020-12-07 MED ORDER — TIZANIDINE HCL 4 MG PO CAPS: 4.0000 mg | ORAL_CAPSULE | Freq: Three times a day (TID) | ORAL | 0 refills | Status: DC

## 2020-12-07 MED ORDER — IBUPROFEN 800 MG PO TABS: 800.0000 mg | ORAL_TABLET | Freq: Three times a day (TID) | ORAL | 0 refills | Status: AC | PRN
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
## 2020-12-07 NOTE — Discharge Instructions (Signed)
Your x-rays were normal with no evidence of fracture.  I suspect you will likely continue to have some stiffness over the next several days.  Please take ibuprofen 800 mg up to 3 times a day as needed.  Do not take additional NSAIDs with this medication such as ibuprofen/Motrin/Advil, naproxen/Aleve, aspirin as it can cause stomach bleeding.  You can use Tylenol for breakthrough pain.  Take tizanidine up to 3 times a day as needed for muscle pain and stiffness.  This medication will make you sleepy do not drive or drink alcohol while taking it.  Make sure that you use heat, rest, stretch for symptom relief.  If you have any worsening symptoms you need to be reevaluated.

## 2021-05-08 ENCOUNTER — Encounter (HOSPITAL_COMMUNITY): Payer: Self-pay

## 2021-05-08 ENCOUNTER — Other Ambulatory Visit: Payer: Self-pay

## 2021-05-08 ENCOUNTER — Ambulatory Visit (HOSPITAL_COMMUNITY)
Admission: EM | Admit: 2021-05-08 | Discharge: 2021-05-08 | Disposition: A | Discharge status: Home / Self Care | Payer: 59 | Attending: Sports Medicine | Admitting: Sports Medicine

## 2021-05-08 DIAGNOSIS — R109 Unspecified abdominal pain: Secondary | ICD-10-CM | POA: Diagnosis present

## 2021-05-08 DIAGNOSIS — M545 Low back pain, unspecified: Secondary | ICD-10-CM | POA: Insufficient documentation

## 2021-05-08 LAB — POCT URINALYSIS DIPSTICK, ED / UC
Bilirubin Urine: NEGATIVE
Glucose, UA: NEGATIVE mg/dL
Hgb urine dipstick: NEGATIVE
Ketones, ur: NEGATIVE mg/dL
Leukocytes,Ua: NEGATIVE
Nitrite: NEGATIVE
Protein, ur: NEGATIVE mg/dL
Specific Gravity, Urine: 1.025 (ref 1.005–1.030)
Urobilinogen, UA: 0.2 mg/dL (ref 0.0–1.0)
pH: 6 (ref 5.0–8.0)

## 2021-05-08 MED ORDER — MELOXICAM 15 MG PO TABS: 15.0000 mg | ORAL_TABLET | Freq: Every day | ORAL | 0 refills | Status: AC
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
## 2021-05-08 NOTE — Discharge Instructions (Signed)
-   Your urine did not show any evidence of infection today.  We did send the urine off for culture, we will call you if this grows any bacteria -You may take meloxicam 1 tablet once a day as needed for back/side pain -Can begin some low back exercises as able -Recommend following up with your PCP, you may better evaluate if you have any signs of kidney stones -If any worsening back or side pain, fever or chills, or signs of bladder infection, would recommend reevaluation in the urgent care

## 2021-05-08 NOTE — ED Triage Notes (Signed)
Pt presents with c/o possible UTI. States she has been drinking a lot of Soda. C/o back pain and side pain.

## 2021-05-10 LAB — URINE CULTURE: Culture: <10000 — AB

## 2021-07-14 ENCOUNTER — Other Ambulatory Visit: Payer: Self-pay

## 2021-07-14 ENCOUNTER — Ambulatory Visit (HOSPITAL_COMMUNITY)
Admission: EM | Admit: 2021-07-14 | Discharge: 2021-07-14 | Disposition: A | Discharge status: Home / Self Care | Payer: 59

## 2021-07-14 ENCOUNTER — Encounter (HOSPITAL_COMMUNITY): Payer: Self-pay

## 2021-07-14 DIAGNOSIS — S39012A Strain of muscle, fascia and tendon of lower back, initial encounter: Secondary | ICD-10-CM
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
## 2021-07-14 NOTE — Discharge Instructions (Addendum)
-  Muscle relaxer, lidocaine patch, biofreeze, etc.  -Call PCP on Monday to discuss further mangement. It's probably time to see a spine specialist.
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
## 2021-08-02 ENCOUNTER — Ambulatory Visit
Admission: RE | Admit: 2021-08-02 | Discharge: 2021-08-02 | Disposition: A | Discharge status: Home / Self Care | Payer: No Typology Code available for payment source | Source: Ambulatory Visit | Attending: Nurse Practitioner | Admitting: Nurse Practitioner

## 2021-08-02 ENCOUNTER — Other Ambulatory Visit: Payer: Self-pay | Admitting: Nurse Practitioner

## 2021-08-02 DIAGNOSIS — M545 Low back pain, unspecified: Secondary | ICD-10-CM

## 2021-08-30 ENCOUNTER — Other Ambulatory Visit: Payer: Self-pay

## 2021-08-30 ENCOUNTER — Ambulatory Visit: Payer: Self-pay | Admitting: Surgery

## 2021-08-30 ENCOUNTER — Encounter: Payer: Self-pay | Admitting: Surgery

## 2021-08-30 VITALS — Ht 71.0 in | Wt 226.8 lb

## 2021-08-30 DIAGNOSIS — M4726 Other spondylosis with radiculopathy, lumbar region: Secondary | ICD-10-CM

## 2021-09-18 ENCOUNTER — Encounter: Payer: Self-pay | Admitting: Physical Therapy

## 2021-09-18 ENCOUNTER — Ambulatory Visit: Payer: 59 | Admitting: Physical Therapy

## 2021-09-18 DIAGNOSIS — M6281 Muscle weakness (generalized): Secondary | ICD-10-CM | POA: Diagnosis not present

## 2021-09-18 DIAGNOSIS — M5459 Other low back pain: Secondary | ICD-10-CM | POA: Diagnosis not present

## 2021-09-18 DIAGNOSIS — R262 Difficulty in walking, not elsewhere classified: Secondary | ICD-10-CM | POA: Diagnosis not present
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
## 2021-09-20 ENCOUNTER — Ambulatory Visit (INDEPENDENT_AMBULATORY_CARE_PROVIDER_SITE_OTHER): Payer: 59 | Admitting: Physical Therapy

## 2021-09-20 ENCOUNTER — Encounter: Payer: Self-pay | Admitting: Physical Therapy

## 2021-09-20 DIAGNOSIS — M5459 Other low back pain: Secondary | ICD-10-CM | POA: Diagnosis not present

## 2021-09-20 DIAGNOSIS — R262 Difficulty in walking, not elsewhere classified: Secondary | ICD-10-CM

## 2021-09-20 DIAGNOSIS — M6281 Muscle weakness (generalized): Secondary | ICD-10-CM | POA: Diagnosis not present
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
## 2021-09-25 ENCOUNTER — Ambulatory Visit (INDEPENDENT_AMBULATORY_CARE_PROVIDER_SITE_OTHER): Payer: 59 | Admitting: Physical Therapy

## 2021-09-25 ENCOUNTER — Encounter: Payer: Self-pay | Admitting: Physical Therapy

## 2021-09-25 DIAGNOSIS — M6281 Muscle weakness (generalized): Secondary | ICD-10-CM | POA: Diagnosis not present

## 2021-09-25 DIAGNOSIS — R262 Difficulty in walking, not elsewhere classified: Secondary | ICD-10-CM | POA: Diagnosis not present

## 2021-09-25 DIAGNOSIS — M5459 Other low back pain: Secondary | ICD-10-CM
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
## 2021-09-27 ENCOUNTER — Ambulatory Visit (INDEPENDENT_AMBULATORY_CARE_PROVIDER_SITE_OTHER): Payer: 59 | Admitting: Physical Therapy

## 2021-09-27 ENCOUNTER — Encounter: Payer: Self-pay | Admitting: Physical Therapy

## 2021-09-27 DIAGNOSIS — R262 Difficulty in walking, not elsewhere classified: Secondary | ICD-10-CM

## 2021-09-27 DIAGNOSIS — M5459 Other low back pain: Secondary | ICD-10-CM | POA: Diagnosis not present

## 2021-09-27 DIAGNOSIS — M6281 Muscle weakness (generalized): Secondary | ICD-10-CM | POA: Diagnosis not present
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
## 2021-10-02 ENCOUNTER — Encounter: Payer: 59 | Admitting: Physical Therapy

## 2021-10-06 ENCOUNTER — Ambulatory Visit (INDEPENDENT_AMBULATORY_CARE_PROVIDER_SITE_OTHER): Payer: 59 | Admitting: Physical Therapy

## 2021-10-06 DIAGNOSIS — R262 Difficulty in walking, not elsewhere classified: Secondary | ICD-10-CM | POA: Diagnosis not present

## 2021-10-06 DIAGNOSIS — M6281 Muscle weakness (generalized): Secondary | ICD-10-CM

## 2021-10-06 DIAGNOSIS — M5459 Other low back pain: Secondary | ICD-10-CM
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
## 2021-10-09 ENCOUNTER — Encounter: Payer: Self-pay | Admitting: Physical Therapy

## 2021-10-09 ENCOUNTER — Ambulatory Visit: Payer: 59 | Admitting: Physical Therapy

## 2021-10-09 DIAGNOSIS — M5459 Other low back pain: Secondary | ICD-10-CM

## 2021-10-09 DIAGNOSIS — M6281 Muscle weakness (generalized): Secondary | ICD-10-CM

## 2021-10-09 DIAGNOSIS — R262 Difficulty in walking, not elsewhere classified: Secondary | ICD-10-CM
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
## 2021-10-13 ENCOUNTER — Encounter: Payer: Self-pay | Admitting: Physical Therapy

## 2021-10-13 ENCOUNTER — Ambulatory Visit (INDEPENDENT_AMBULATORY_CARE_PROVIDER_SITE_OTHER): Payer: 59 | Admitting: Physical Therapy

## 2021-10-13 DIAGNOSIS — M6281 Muscle weakness (generalized): Secondary | ICD-10-CM | POA: Diagnosis not present

## 2021-10-13 DIAGNOSIS — R262 Difficulty in walking, not elsewhere classified: Secondary | ICD-10-CM

## 2021-10-13 DIAGNOSIS — M5459 Other low back pain: Secondary | ICD-10-CM | POA: Diagnosis not present
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
## 2021-10-18 ENCOUNTER — Ambulatory Visit: Payer: 59 | Admitting: Orthopaedic Surgery

## 2021-10-25 ENCOUNTER — Encounter: Payer: 59 | Admitting: Physical Therapy

## 2021-10-27 ENCOUNTER — Encounter: Payer: 59 | Admitting: Physical Therapy

## 2021-11-01 ENCOUNTER — Encounter: Payer: Self-pay | Admitting: Physical Therapy

## 2021-11-01 ENCOUNTER — Ambulatory Visit (INDEPENDENT_AMBULATORY_CARE_PROVIDER_SITE_OTHER): Payer: 59 | Admitting: Physical Therapy

## 2021-11-01 DIAGNOSIS — R262 Difficulty in walking, not elsewhere classified: Secondary | ICD-10-CM | POA: Diagnosis not present

## 2021-11-01 DIAGNOSIS — M6281 Muscle weakness (generalized): Secondary | ICD-10-CM

## 2021-11-01 DIAGNOSIS — M5459 Other low back pain: Secondary | ICD-10-CM
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
## 2021-11-06 ENCOUNTER — Encounter: Payer: Self-pay | Admitting: Physical Therapy

## 2021-11-06 ENCOUNTER — Ambulatory Visit: Payer: 59 | Admitting: Physical Therapy

## 2021-11-06 DIAGNOSIS — M6281 Muscle weakness (generalized): Secondary | ICD-10-CM

## 2021-11-06 DIAGNOSIS — M5459 Other low back pain: Secondary | ICD-10-CM

## 2021-11-06 DIAGNOSIS — R262 Difficulty in walking, not elsewhere classified: Secondary | ICD-10-CM | POA: Diagnosis not present
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
## 2021-11-08 ENCOUNTER — Encounter: Payer: Self-pay | Admitting: Physical Therapy

## 2021-11-08 ENCOUNTER — Ambulatory Visit (INDEPENDENT_AMBULATORY_CARE_PROVIDER_SITE_OTHER): Payer: 59 | Admitting: Physical Therapy

## 2021-11-08 ENCOUNTER — Ambulatory Visit: Payer: 59 | Admitting: Orthopaedic Surgery

## 2021-11-08 DIAGNOSIS — M545 Low back pain, unspecified: Secondary | ICD-10-CM | POA: Diagnosis not present

## 2021-11-08 DIAGNOSIS — M6281 Muscle weakness (generalized): Secondary | ICD-10-CM | POA: Diagnosis not present

## 2021-11-08 DIAGNOSIS — M5459 Other low back pain: Secondary | ICD-10-CM

## 2021-11-08 DIAGNOSIS — R262 Difficulty in walking, not elsewhere classified: Secondary | ICD-10-CM
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
## 2021-11-21 ENCOUNTER — Encounter: Payer: Self-pay | Admitting: Physical Therapy

## 2021-11-21 ENCOUNTER — Ambulatory Visit: Payer: 59 | Admitting: Physical Therapy

## 2021-11-21 DIAGNOSIS — R262 Difficulty in walking, not elsewhere classified: Secondary | ICD-10-CM | POA: Diagnosis not present

## 2021-11-21 DIAGNOSIS — M6281 Muscle weakness (generalized): Secondary | ICD-10-CM

## 2021-11-21 DIAGNOSIS — M5459 Other low back pain: Secondary | ICD-10-CM | POA: Diagnosis not present
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
## 2021-11-23 ENCOUNTER — Encounter: Payer: Self-pay | Admitting: Physical Therapy

## 2021-11-23 ENCOUNTER — Ambulatory Visit (INDEPENDENT_AMBULATORY_CARE_PROVIDER_SITE_OTHER): Payer: 59 | Admitting: Physical Therapy

## 2021-11-23 DIAGNOSIS — R262 Difficulty in walking, not elsewhere classified: Secondary | ICD-10-CM | POA: Diagnosis not present

## 2021-11-23 DIAGNOSIS — M5459 Other low back pain: Secondary | ICD-10-CM | POA: Diagnosis not present

## 2021-11-23 DIAGNOSIS — M6281 Muscle weakness (generalized): Secondary | ICD-10-CM
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
## 2021-11-27 ENCOUNTER — Encounter: Payer: Self-pay | Admitting: Physical Therapy

## 2021-11-27 ENCOUNTER — Ambulatory Visit (INDEPENDENT_AMBULATORY_CARE_PROVIDER_SITE_OTHER): Payer: 59 | Admitting: Physical Therapy

## 2021-11-27 DIAGNOSIS — M5459 Other low back pain: Secondary | ICD-10-CM

## 2021-11-27 DIAGNOSIS — R262 Difficulty in walking, not elsewhere classified: Secondary | ICD-10-CM

## 2021-11-27 DIAGNOSIS — M6281 Muscle weakness (generalized): Secondary | ICD-10-CM | POA: Diagnosis not present

## 2021-11-29 ENCOUNTER — Encounter: Payer: Self-pay | Admitting: Physical Therapy

## 2021-11-29 ENCOUNTER — Ambulatory Visit: Payer: 59 | Admitting: Physical Therapy

## 2021-11-29 DIAGNOSIS — R262 Difficulty in walking, not elsewhere classified: Secondary | ICD-10-CM | POA: Diagnosis not present

## 2021-11-29 DIAGNOSIS — M5459 Other low back pain: Secondary | ICD-10-CM

## 2021-11-29 DIAGNOSIS — M6281 Muscle weakness (generalized): Secondary | ICD-10-CM | POA: Diagnosis not present
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
## 2021-12-04 ENCOUNTER — Ambulatory Visit: Payer: 59 | Admitting: Physical Therapy

## 2021-12-04 ENCOUNTER — Encounter: Payer: Self-pay | Admitting: Physical Therapy

## 2021-12-04 DIAGNOSIS — M6281 Muscle weakness (generalized): Secondary | ICD-10-CM

## 2021-12-04 DIAGNOSIS — R262 Difficulty in walking, not elsewhere classified: Secondary | ICD-10-CM | POA: Diagnosis not present

## 2021-12-04 DIAGNOSIS — M5459 Other low back pain: Secondary | ICD-10-CM
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
## 2021-12-08 ENCOUNTER — Ambulatory Visit: Payer: 59 | Admitting: Physical Therapy

## 2021-12-08 ENCOUNTER — Encounter: Payer: Self-pay | Admitting: Physical Therapy

## 2021-12-08 DIAGNOSIS — R262 Difficulty in walking, not elsewhere classified: Secondary | ICD-10-CM | POA: Diagnosis not present

## 2021-12-08 DIAGNOSIS — M6281 Muscle weakness (generalized): Secondary | ICD-10-CM

## 2021-12-08 DIAGNOSIS — M5459 Other low back pain: Secondary | ICD-10-CM | POA: Diagnosis not present
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
## 2021-12-11 ENCOUNTER — Ambulatory Visit (INDEPENDENT_AMBULATORY_CARE_PROVIDER_SITE_OTHER): Payer: 59 | Admitting: Physical Therapy

## 2021-12-11 ENCOUNTER — Encounter: Payer: Self-pay | Admitting: Physical Therapy

## 2021-12-11 DIAGNOSIS — R262 Difficulty in walking, not elsewhere classified: Secondary | ICD-10-CM | POA: Diagnosis not present

## 2021-12-11 DIAGNOSIS — M5459 Other low back pain: Secondary | ICD-10-CM | POA: Diagnosis not present

## 2021-12-11 DIAGNOSIS — M6281 Muscle weakness (generalized): Secondary | ICD-10-CM
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
## 2021-12-13 ENCOUNTER — Ambulatory Visit: Payer: 59 | Admitting: Physical Therapy

## 2021-12-13 ENCOUNTER — Encounter: Payer: Self-pay | Admitting: Physical Therapy

## 2021-12-13 DIAGNOSIS — R262 Difficulty in walking, not elsewhere classified: Secondary | ICD-10-CM | POA: Diagnosis not present

## 2021-12-13 DIAGNOSIS — M5459 Other low back pain: Secondary | ICD-10-CM

## 2021-12-13 DIAGNOSIS — M6281 Muscle weakness (generalized): Secondary | ICD-10-CM | POA: Diagnosis not present
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
## 2021-12-18 ENCOUNTER — Ambulatory Visit: Payer: 59 | Admitting: Physical Therapy

## 2021-12-18 ENCOUNTER — Encounter: Payer: Self-pay | Admitting: Physical Therapy

## 2021-12-18 DIAGNOSIS — M5459 Other low back pain: Secondary | ICD-10-CM

## 2021-12-18 DIAGNOSIS — M6281 Muscle weakness (generalized): Secondary | ICD-10-CM

## 2021-12-18 DIAGNOSIS — R262 Difficulty in walking, not elsewhere classified: Secondary | ICD-10-CM | POA: Diagnosis not present
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
## 2021-12-20 ENCOUNTER — Encounter: Payer: Self-pay | Admitting: Physical Therapy

## 2021-12-20 ENCOUNTER — Ambulatory Visit: Payer: 59 | Admitting: Physical Therapy

## 2021-12-20 DIAGNOSIS — M5459 Other low back pain: Secondary | ICD-10-CM | POA: Diagnosis not present

## 2021-12-20 DIAGNOSIS — M6281 Muscle weakness (generalized): Secondary | ICD-10-CM

## 2021-12-20 DIAGNOSIS — R262 Difficulty in walking, not elsewhere classified: Secondary | ICD-10-CM | POA: Diagnosis not present
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
## 2021-12-25 ENCOUNTER — Encounter: Payer: Self-pay | Admitting: Physical Therapy

## 2021-12-25 ENCOUNTER — Ambulatory Visit: Payer: 59 | Admitting: Physical Therapy

## 2021-12-25 DIAGNOSIS — M5459 Other low back pain: Secondary | ICD-10-CM

## 2021-12-25 DIAGNOSIS — M6281 Muscle weakness (generalized): Secondary | ICD-10-CM

## 2021-12-25 DIAGNOSIS — R262 Difficulty in walking, not elsewhere classified: Secondary | ICD-10-CM
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
## 2021-12-28 ENCOUNTER — Encounter: Payer: Self-pay | Admitting: Physical Therapy

## 2021-12-28 ENCOUNTER — Ambulatory Visit: Payer: 59 | Admitting: Physical Therapy

## 2021-12-28 DIAGNOSIS — M6281 Muscle weakness (generalized): Secondary | ICD-10-CM

## 2021-12-28 DIAGNOSIS — M5459 Other low back pain: Secondary | ICD-10-CM

## 2021-12-28 DIAGNOSIS — R262 Difficulty in walking, not elsewhere classified: Secondary | ICD-10-CM | POA: Diagnosis not present
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
## 2022-01-01 ENCOUNTER — Ambulatory Visit: Payer: 59 | Admitting: Physical Therapy

## 2022-01-01 ENCOUNTER — Encounter: Payer: Self-pay | Admitting: Physical Therapy

## 2022-01-01 DIAGNOSIS — M5459 Other low back pain: Secondary | ICD-10-CM

## 2022-01-01 DIAGNOSIS — M6281 Muscle weakness (generalized): Secondary | ICD-10-CM

## 2022-01-01 DIAGNOSIS — R262 Difficulty in walking, not elsewhere classified: Secondary | ICD-10-CM
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
## 2022-01-03 ENCOUNTER — Ambulatory Visit: Payer: Commercial Managed Care - HMO | Admitting: Physical Therapy

## 2022-01-03 ENCOUNTER — Encounter: Payer: Self-pay | Admitting: Physical Therapy

## 2022-01-03 DIAGNOSIS — R262 Difficulty in walking, not elsewhere classified: Secondary | ICD-10-CM | POA: Diagnosis not present

## 2022-01-03 DIAGNOSIS — M5459 Other low back pain: Secondary | ICD-10-CM | POA: Diagnosis not present

## 2022-01-03 DIAGNOSIS — M6281 Muscle weakness (generalized): Secondary | ICD-10-CM
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
## 2022-01-08 ENCOUNTER — Encounter: Payer: Self-pay | Admitting: Physical Therapy

## 2022-01-08 ENCOUNTER — Ambulatory Visit: Payer: 59 | Admitting: Physical Therapy

## 2022-01-08 DIAGNOSIS — M5459 Other low back pain: Secondary | ICD-10-CM | POA: Diagnosis not present

## 2022-01-08 DIAGNOSIS — R262 Difficulty in walking, not elsewhere classified: Secondary | ICD-10-CM | POA: Diagnosis not present

## 2022-01-08 DIAGNOSIS — M6281 Muscle weakness (generalized): Secondary | ICD-10-CM | POA: Diagnosis not present
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
## 2022-01-10 ENCOUNTER — Encounter: Payer: Self-pay | Admitting: Physical Therapy

## 2022-01-10 ENCOUNTER — Ambulatory Visit (INDEPENDENT_AMBULATORY_CARE_PROVIDER_SITE_OTHER): Payer: Commercial Managed Care - HMO | Admitting: Physical Therapy

## 2022-01-10 DIAGNOSIS — R262 Difficulty in walking, not elsewhere classified: Secondary | ICD-10-CM | POA: Diagnosis not present

## 2022-01-10 DIAGNOSIS — M6281 Muscle weakness (generalized): Secondary | ICD-10-CM

## 2022-01-10 DIAGNOSIS — M5459 Other low back pain: Secondary | ICD-10-CM | POA: Diagnosis not present
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
## 2022-01-15 ENCOUNTER — Ambulatory Visit: Payer: Commercial Managed Care - HMO | Admitting: Physical Therapy

## 2022-01-15 ENCOUNTER — Encounter: Payer: Self-pay | Admitting: Physical Therapy

## 2022-01-15 DIAGNOSIS — R262 Difficulty in walking, not elsewhere classified: Secondary | ICD-10-CM | POA: Diagnosis not present

## 2022-01-15 DIAGNOSIS — M5459 Other low back pain: Secondary | ICD-10-CM

## 2022-01-15 DIAGNOSIS — M6281 Muscle weakness (generalized): Secondary | ICD-10-CM
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
## 2022-01-17 ENCOUNTER — Encounter: Payer: Self-pay | Admitting: Physical Therapy

## 2022-01-17 ENCOUNTER — Ambulatory Visit (INDEPENDENT_AMBULATORY_CARE_PROVIDER_SITE_OTHER): Payer: Commercial Managed Care - HMO | Admitting: Physical Therapy

## 2022-01-17 DIAGNOSIS — M6281 Muscle weakness (generalized): Secondary | ICD-10-CM | POA: Diagnosis not present

## 2022-01-17 DIAGNOSIS — M5459 Other low back pain: Secondary | ICD-10-CM | POA: Diagnosis not present

## 2022-01-17 DIAGNOSIS — R262 Difficulty in walking, not elsewhere classified: Secondary | ICD-10-CM
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
## 2022-01-22 ENCOUNTER — Encounter: Payer: Self-pay | Admitting: Physical Therapy

## 2022-01-22 ENCOUNTER — Ambulatory Visit (INDEPENDENT_AMBULATORY_CARE_PROVIDER_SITE_OTHER): Payer: Commercial Managed Care - HMO | Admitting: Physical Therapy

## 2022-01-22 DIAGNOSIS — M6281 Muscle weakness (generalized): Secondary | ICD-10-CM

## 2022-01-22 DIAGNOSIS — R262 Difficulty in walking, not elsewhere classified: Secondary | ICD-10-CM | POA: Diagnosis not present

## 2022-01-22 DIAGNOSIS — M5459 Other low back pain: Secondary | ICD-10-CM | POA: Diagnosis not present
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
## 2022-01-24 ENCOUNTER — Ambulatory Visit (INDEPENDENT_AMBULATORY_CARE_PROVIDER_SITE_OTHER): Payer: Commercial Managed Care - HMO | Admitting: Physical Therapy

## 2022-01-24 ENCOUNTER — Encounter: Payer: Self-pay | Admitting: Physical Therapy

## 2022-01-24 DIAGNOSIS — R262 Difficulty in walking, not elsewhere classified: Secondary | ICD-10-CM | POA: Diagnosis not present

## 2022-01-24 DIAGNOSIS — M5459 Other low back pain: Secondary | ICD-10-CM | POA: Diagnosis not present

## 2022-01-24 DIAGNOSIS — M6281 Muscle weakness (generalized): Secondary | ICD-10-CM | POA: Diagnosis not present
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
## 2022-01-31 ENCOUNTER — Encounter: Payer: 59 | Admitting: Physical Therapy

## 2022-02-07 ENCOUNTER — Encounter: Payer: Commercial Managed Care - HMO | Admitting: Physical Therapy

## 2022-02-09 ENCOUNTER — Encounter: Payer: Self-pay | Admitting: Physical Therapy

## 2022-02-09 ENCOUNTER — Ambulatory Visit (INDEPENDENT_AMBULATORY_CARE_PROVIDER_SITE_OTHER): Payer: Commercial Managed Care - HMO | Admitting: Physical Therapy

## 2022-02-09 DIAGNOSIS — M5459 Other low back pain: Secondary | ICD-10-CM | POA: Diagnosis not present

## 2022-02-09 DIAGNOSIS — M6281 Muscle weakness (generalized): Secondary | ICD-10-CM

## 2022-02-09 DIAGNOSIS — R262 Difficulty in walking, not elsewhere classified: Secondary | ICD-10-CM | POA: Diagnosis not present
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
## 2022-02-12 ENCOUNTER — Ambulatory Visit (INDEPENDENT_AMBULATORY_CARE_PROVIDER_SITE_OTHER): Payer: Commercial Managed Care - HMO | Admitting: Physical Therapy

## 2022-02-12 ENCOUNTER — Encounter: Payer: Self-pay | Admitting: Physical Therapy

## 2022-02-12 DIAGNOSIS — R262 Difficulty in walking, not elsewhere classified: Secondary | ICD-10-CM

## 2022-02-12 DIAGNOSIS — M6281 Muscle weakness (generalized): Secondary | ICD-10-CM | POA: Diagnosis not present

## 2022-02-12 DIAGNOSIS — M5459 Other low back pain: Secondary | ICD-10-CM
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
## 2022-02-14 ENCOUNTER — Encounter: Payer: Self-pay | Admitting: Physical Therapy

## 2022-02-14 ENCOUNTER — Ambulatory Visit: Payer: 59 | Admitting: Physical Therapy

## 2022-02-14 DIAGNOSIS — R262 Difficulty in walking, not elsewhere classified: Secondary | ICD-10-CM

## 2022-02-14 DIAGNOSIS — M6281 Muscle weakness (generalized): Secondary | ICD-10-CM

## 2022-02-14 DIAGNOSIS — M5459 Other low back pain: Secondary | ICD-10-CM
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
## 2022-02-19 ENCOUNTER — Encounter: Payer: Self-pay | Admitting: Physical Therapy

## 2022-02-19 ENCOUNTER — Ambulatory Visit: Payer: Commercial Managed Care - HMO | Admitting: Physical Therapy

## 2022-02-19 DIAGNOSIS — M5459 Other low back pain: Secondary | ICD-10-CM

## 2022-02-19 DIAGNOSIS — M6281 Muscle weakness (generalized): Secondary | ICD-10-CM

## 2022-02-19 DIAGNOSIS — R262 Difficulty in walking, not elsewhere classified: Secondary | ICD-10-CM
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
## 2022-02-21 ENCOUNTER — Encounter: Payer: Self-pay | Admitting: Physical Therapy

## 2022-02-21 ENCOUNTER — Ambulatory Visit: Payer: 59 | Admitting: Physical Therapy

## 2022-02-21 DIAGNOSIS — M6281 Muscle weakness (generalized): Secondary | ICD-10-CM

## 2022-02-21 DIAGNOSIS — R262 Difficulty in walking, not elsewhere classified: Secondary | ICD-10-CM

## 2022-02-21 DIAGNOSIS — M5459 Other low back pain: Secondary | ICD-10-CM
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
## 2022-02-26 ENCOUNTER — Ambulatory Visit: Payer: Commercial Managed Care - HMO | Admitting: Physical Therapy

## 2022-02-26 ENCOUNTER — Encounter: Payer: Self-pay | Admitting: Physical Therapy

## 2022-02-26 DIAGNOSIS — R262 Difficulty in walking, not elsewhere classified: Secondary | ICD-10-CM

## 2022-02-26 DIAGNOSIS — M5459 Other low back pain: Secondary | ICD-10-CM | POA: Diagnosis not present

## 2022-02-26 DIAGNOSIS — M6281 Muscle weakness (generalized): Secondary | ICD-10-CM
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
## 2022-02-28 ENCOUNTER — Encounter: Payer: 59 | Admitting: Physical Therapy

## 2022-03-05 ENCOUNTER — Encounter: Payer: Commercial Managed Care - HMO | Admitting: Physical Therapy

## 2022-03-07 ENCOUNTER — Ambulatory Visit: Payer: Commercial Managed Care - HMO | Admitting: Physical Therapy

## 2022-03-07 ENCOUNTER — Encounter: Payer: Self-pay | Admitting: Physical Therapy

## 2022-03-07 DIAGNOSIS — M6281 Muscle weakness (generalized): Secondary | ICD-10-CM

## 2022-03-07 DIAGNOSIS — M5459 Other low back pain: Secondary | ICD-10-CM

## 2022-03-07 DIAGNOSIS — R262 Difficulty in walking, not elsewhere classified: Secondary | ICD-10-CM | POA: Diagnosis not present
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
## 2022-03-12 ENCOUNTER — Encounter: Payer: Self-pay | Admitting: Physical Therapy

## 2022-03-12 ENCOUNTER — Ambulatory Visit: Payer: Commercial Managed Care - HMO | Admitting: Physical Therapy

## 2022-03-12 DIAGNOSIS — M5459 Other low back pain: Secondary | ICD-10-CM

## 2022-03-12 DIAGNOSIS — R262 Difficulty in walking, not elsewhere classified: Secondary | ICD-10-CM | POA: Diagnosis not present

## 2022-03-12 DIAGNOSIS — M6281 Muscle weakness (generalized): Secondary | ICD-10-CM | POA: Diagnosis not present
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
## 2022-03-14 ENCOUNTER — Ambulatory Visit: Payer: Commercial Managed Care - HMO | Admitting: Physical Therapy

## 2022-03-14 ENCOUNTER — Encounter: Payer: Self-pay | Admitting: Physical Therapy

## 2022-03-14 DIAGNOSIS — R262 Difficulty in walking, not elsewhere classified: Secondary | ICD-10-CM

## 2022-03-14 DIAGNOSIS — M6281 Muscle weakness (generalized): Secondary | ICD-10-CM | POA: Diagnosis not present

## 2022-03-14 DIAGNOSIS — M5459 Other low back pain: Secondary | ICD-10-CM | POA: Diagnosis not present
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
## 2022-03-19 ENCOUNTER — Ambulatory Visit: Payer: Commercial Managed Care - HMO | Admitting: Physical Therapy

## 2022-03-19 ENCOUNTER — Encounter: Payer: Self-pay | Admitting: Physical Therapy

## 2022-03-19 DIAGNOSIS — R262 Difficulty in walking, not elsewhere classified: Secondary | ICD-10-CM

## 2022-03-19 DIAGNOSIS — M6281 Muscle weakness (generalized): Secondary | ICD-10-CM | POA: Diagnosis not present

## 2022-03-19 DIAGNOSIS — M5459 Other low back pain: Secondary | ICD-10-CM
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
## 2022-03-21 ENCOUNTER — Ambulatory Visit (INDEPENDENT_AMBULATORY_CARE_PROVIDER_SITE_OTHER): Payer: Commercial Managed Care - HMO | Admitting: Physical Therapy

## 2022-03-21 ENCOUNTER — Encounter: Payer: Self-pay | Admitting: Physical Therapy

## 2022-03-21 DIAGNOSIS — R262 Difficulty in walking, not elsewhere classified: Secondary | ICD-10-CM | POA: Diagnosis not present

## 2022-03-21 DIAGNOSIS — M5459 Other low back pain: Secondary | ICD-10-CM | POA: Diagnosis not present

## 2022-03-21 DIAGNOSIS — M6281 Muscle weakness (generalized): Secondary | ICD-10-CM
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
## 2022-03-26 ENCOUNTER — Encounter: Payer: Self-pay | Admitting: Physical Therapy

## 2022-03-26 ENCOUNTER — Ambulatory Visit: Payer: Commercial Managed Care - HMO | Admitting: Physical Therapy

## 2022-03-26 DIAGNOSIS — M6281 Muscle weakness (generalized): Secondary | ICD-10-CM

## 2022-03-26 DIAGNOSIS — R262 Difficulty in walking, not elsewhere classified: Secondary | ICD-10-CM

## 2022-03-26 DIAGNOSIS — M5459 Other low back pain: Secondary | ICD-10-CM | POA: Diagnosis not present
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
## 2022-03-28 ENCOUNTER — Encounter: Payer: Self-pay | Admitting: Orthopaedic Surgery

## 2022-03-28 ENCOUNTER — Ambulatory Visit: Payer: Commercial Managed Care - HMO | Admitting: Orthopaedic Surgery

## 2022-03-28 ENCOUNTER — Ambulatory Visit: Payer: Commercial Managed Care - HMO | Admitting: Physical Therapy

## 2022-03-28 ENCOUNTER — Ambulatory Visit (INDEPENDENT_AMBULATORY_CARE_PROVIDER_SITE_OTHER): Payer: Commercial Managed Care - HMO

## 2022-03-28 ENCOUNTER — Encounter: Payer: Self-pay | Admitting: Physical Therapy

## 2022-03-28 VITALS — BP 111/73 | HR 76 | Ht 71.0 in | Wt 220.0 lb

## 2022-03-28 DIAGNOSIS — M25561 Pain in right knee: Secondary | ICD-10-CM

## 2022-03-28 DIAGNOSIS — M79671 Pain in right foot: Secondary | ICD-10-CM

## 2022-03-28 DIAGNOSIS — M5459 Other low back pain: Secondary | ICD-10-CM | POA: Diagnosis not present

## 2022-03-28 DIAGNOSIS — R262 Difficulty in walking, not elsewhere classified: Secondary | ICD-10-CM | POA: Diagnosis not present

## 2022-03-28 DIAGNOSIS — M6281 Muscle weakness (generalized): Secondary | ICD-10-CM | POA: Diagnosis not present

## 2022-03-28 DIAGNOSIS — G8929 Other chronic pain: Secondary | ICD-10-CM

## 2022-03-28 NOTE — Progress Notes (Signed)
Office Visit Note   [name]           Date of Birth: 1955/12/16           MRN: [id number] Visit Date: 03/28/2022              Requested by: [facility] [address] [place],   [id number] PCP: [facility]   Assessment & Plan: Visit Diagnoses:  1. Chronic pain of right knee   2. Pain in right foot     Plan: Patient has some mild arthritis medial compartment both knees and right knee injection performed.  I think she is likely maximized her improvement with her back with therapy.  She can follow-up if she develops any acute locking in her knee.  If she has recurrent problems with her back persistent problems neck step would be lumbar MRI imaging.  Follow-Up Instructions: No follow-ups on file.   Orders:  Orders Placed This Encounter  Procedures   Large Joint Inj: R knee   XR KNEE 3 VIEW RIGHT   XR Foot Complete Right   No orders of the defined types were placed in this encounter.     Procedures: Large Joint Inj: R knee on 03/28/2022 4:39 PM Indications: pain and joint swelling Details: 22 G 1.5 in needle, anterolateral approach  Arthrogram: No  Medications: 40 mg methylPREDNISolone acetate 40 MG/ML; 0.5 mL lidocaine 1 %; 4 mL bupivacaine 0.25 % Outcome: tolerated well, no immediate complications Procedure, treatment alternatives, risks and benefits explained, specific risks discussed. Consent was given by the patient. Immediately prior to procedure a time out was called to verify the correct patient, procedure, equipment, support staff and site/side marked as required. Patient was prepped and draped in the usual sterile fashion.       Clinical Data: No additional findings.   Subjective: Chief Complaint  Patient presents with   Right Knee - Pain   Right Foot - Pain    HPI 66 year old female seen with bilateral knee pain more on the medial than lateral.  She has been in physical  therapy for her back she is having problems walking for extended periods of time sometimes she notices some limping.  She has had some sharp pain laterally in the foot with history of foot injury 15 years ago she is not sure which foot.  Right knee has been swelling intermittently some popping.  She has used Biofreeze, lidocaine patches, ibuprofen.  There is 2+ knee effusion right knee with some patellofemoral crepitus and more medial than lateral joint line tenderness.  Negative logroll hips.  Patient's had several months of therapy with some improvement in her back.  Review of Systems positive for prediabetes.  All other systems noncontributory HPI.   Objective: Vital Signs: BP 111/73   Pulse 76   Ht '5\' 11"'$  (1.803 m)   Wt 220 lb (99.8 kg)   BMI 30.68 kg/m   Physical Exam Constitutional:      Appearance: She is well-developed.  HENT:     Head: Normocephalic.     Right Ear: External ear normal.     Left Ear: External ear normal. There is no impacted cerumen.  Eyes:     Pupils: Pupils are equal, round, and reactive to light.  Neck:     Thyroid: No thyromegaly.     Trachea: No tracheal deviation.  Cardiovascular:     Rate and Rhythm: Normal rate.  Pulmonary:  Effort: Pulmonary effort is normal.  Abdominal:     Palpations: Abdomen is soft.  Musculoskeletal:     Cervical back: No rigidity.  Skin:    General: Skin is warm and dry.  Neurological:     Mental Status: She is alert and oriented to person, place, and time.  Psychiatric:        Behavior: Behavior normal.     Ortho Exam negative logroll of the hips.  Mild sciatic notch tenderness.  She is amatory with trace right knee limp.  Knee and ankle jerk are intact.  No tenderness over the left knee.  Collateral crucial ligament exam both knees are normal.  Specialty Comments:  No specialty comments available.  Imaging: No results found.   PMFS History: Patient Active Problem List   Diagnosis Date Noted   Low back  pain 11/08/2021   Prediabetes 12/31/2018   Healthcare maintenance 12/09/2018   Breast pain, left 12/09/2018   Past Medical History:  Diagnosis Date   UTI (lower urinary tract infection)     Family History  Problem Relation Age of Onset   Diabetes Mother    Hypertension Mother    Diabetes Father    Hypertension Father    Diabetes Maternal Grandmother    Diabetes Maternal Grandfather    Hyperlipidemia Maternal Grandfather    Diabetes Paternal Grandmother    Diabetes Paternal Grandfather     Past Surgical History:  Procedure Laterality Date   ABDOMINAL HYSTERECTOMY     BREAST BIOPSY Left 10/2015   BREAST BIOPSY Right 10/2015   benign   COLONOSCOPY WITH PROPOFOL N/A 06/03/2017   Procedure: COLONOSCOPY WITH PROPOFOL;  Surgeon: Ronnette Juniper, MD;  Location: [facility];  Service: Gastroenterology;  Laterality: N/A;   FLEXIBLE SIGMOIDOSCOPY N/A 06/25/2017   Procedure: FLEXIBLE SIGMOIDOSCOPY;  Surgeon: Ronnette Juniper, MD;  Location: [facility];  Service: Gastroenterology;  Laterality: N/A;   TONSILLECTOMY     Social History   Occupational History   Not on file  Tobacco Use   Smoking status: Never   Smokeless tobacco: Never  Vaping Use   Vaping Use: Never used  Substance and Sexual Activity   Alcohol use: No   Drug use: No   Sexual activity: Not Currently
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
## 2022-03-30 MED ORDER — BUPIVACAINE HCL 0.25 % IJ SOLN
4.0000 mL | INTRAMUSCULAR | Status: AC | PRN
  Administered 2022-03-28: 4 mL via INTRA_ARTICULAR

## 2022-03-30 MED ORDER — METHYLPREDNISOLONE ACETATE 40 MG/ML IJ SUSP
40.0000 mg | INTRAMUSCULAR | Status: AC | PRN
  Administered 2022-03-28: 40 mg via INTRA_ARTICULAR

## 2022-03-30 MED ORDER — LIDOCAINE HCL 1 % IJ SOLN
0.5000 mL | INTRAMUSCULAR | Status: AC | PRN
  Administered 2022-03-28: .5 mL

## 2022-04-11 ENCOUNTER — Encounter: Payer: Self-pay | Admitting: Physical Therapy

## 2022-04-11 ENCOUNTER — Ambulatory Visit: Payer: Commercial Managed Care - HMO | Admitting: Physical Therapy

## 2022-04-11 DIAGNOSIS — M5459 Other low back pain: Secondary | ICD-10-CM

## 2022-04-11 DIAGNOSIS — M6281 Muscle weakness (generalized): Secondary | ICD-10-CM | POA: Diagnosis not present

## 2022-04-11 DIAGNOSIS — R262 Difficulty in walking, not elsewhere classified: Secondary | ICD-10-CM | POA: Diagnosis not present
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
## 2023-04-25 ENCOUNTER — Other Ambulatory Visit: Payer: Self-pay | Admitting: Nurse Practitioner

## 2023-04-25 DIAGNOSIS — Z1231 Encounter for screening mammogram for malignant neoplasm of breast: Secondary | ICD-10-CM

## 2023-05-15 NOTE — Progress Notes (Signed)
[facility] - Clinic Note  05/20/2023   CHIEF COMPLAINT Patient presents for Retina Evaluation  HISTORY OF PRESENT ILLNESS: Toni Rodriguez is a 67 y.o. female who presents to the clinic today for:  HPI     Retina Evaluation   In left eye.  This started 1 week ago.  Duration of 1 week.  Associated Symptoms Floaters.  I, the attending physician,  performed the HPI with the patient and updated documentation appropriately.        Comments   Pt here for new ret eval for lattice degen w/ hole OS, referred by Dr. Bascom Levels. Pt does not report any va changes but does see a floater on left side-unsure how long its been. No FOL. Pt is on latanoprost at bedtime OU per Dr. Bascom Levels. 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       Last edited by Thompson Grayer, COT on 05/20/2023  8:13 AM.    Pt is here on the referral of Dr. Bascom Levels for concern of lattice with hole OS, pt states she lost her glasses so she went to [facility] to get new glasses, they told her she had elevated IOP and wanted to see someone before she got new glasses, she is on latanoprost at night, pt states she went to her PCP and had blood work done and she does not have diabetes   Referring physician: [place], [place], OD [address] [place],  [place] [address]  HISTORICAL INFORMATION:  Selected notes from the MEDICAL RECORD NUMBER Referred by Dr. Bascom Levels for concern of lattice with retinal hole OS LEE:  Ocular Hx- PMH-   CURRENT MEDICATIONS: No current outpatient medications on file. (Ophthalmic Drugs)   No current facility-administered medications for this visit. (Ophthalmic Drugs)   Current Outpatient Medications (Other)  Medication Sig   ibuprofen (ADVIL) 800 MG tablet Take 1 tablet (800 mg total) by mouth every 8 (eight) hours as needed for moderate pain.   Liniments (SALONPAS PAIN RELIEF PATCH EX) Apply 1 patch topically daily as needed (for pain).   oxybutynin  (DITROPAN) 5 MG tablet Take 1 tablet (5 mg total) by mouth 3 (three) times daily.   Cyanocobalamin (VITAMIN B-12 PO) Take 1 tablet by mouth daily.   loperamide (IMODIUM) 2 MG capsule Take 2 capsules (4 mg total) by mouth 2 (two) times daily as needed for diarrhea or loose stools.   ondansetron (ZOFRAN-ODT) 8 MG disintegrating tablet Take 1 tablet (8 mg total) by mouth every 8 (eight) hours as needed for nausea or vomiting.   tiZANidine (ZANAFLEX) 4 MG capsule Take 1 capsule (4 mg total) by mouth 3 (three) times daily.   No current facility-administered medications for this visit. (Other)   REVIEW OF SYSTEMS: ROS   Positive for: Eyes, Allergic/Imm Negative for: Constitutional, Gastrointestinal, Neurological, Skin, Genitourinary, Musculoskeletal, HENT, Endocrine, Cardiovascular, Respiratory, Psychiatric, Heme/Lymph Last edited by Thompson Grayer, COT on 05/20/2023  8:15 AM.     ALLERGIES Allergies  Allergen Reactions   Penicillins Hives and Other (See Comments)    Has patient had a PCN reaction causing immediate rash, facial/tongue/throat swelling, SOB or lightheadedness with hypotension: Yes Has patient had a PCN reaction causing severe rash involving mucus membranes or skin necrosis: No Has patient had a PCN reaction that required hospitalization: No Has patient had a PCN reaction occurring within the last 10 years: No If all of the above answers are "NO", then may  proceed with Cephalosporin use.    PAST MEDICAL HISTORY Past Medical History:  Diagnosis Date   UTI (lower urinary tract infection)    Past Surgical History:  Procedure Laterality Date   ABDOMINAL HYSTERECTOMY     BREAST BIOPSY Left 10/2015   BREAST BIOPSY Right 10/2015   benign   COLONOSCOPY WITH PROPOFOL N/A 06/03/2017   Procedure: COLONOSCOPY WITH PROPOFOL;  Surgeon: Kerin Salen, MD;  Location: [facility];  Service: Gastroenterology;  Laterality: N/A;   FLEXIBLE SIGMOIDOSCOPY N/A 06/25/2017   Procedure:  FLEXIBLE SIGMOIDOSCOPY;  Surgeon: Kerin Salen, MD;  Location: [facility];  Service: Gastroenterology;  Laterality: N/A;   TONSILLECTOMY     FAMILY HISTORY Family History  Problem Relation Age of Onset   Hypertension Mother    Cataracts Mother    Diabetes Father    Hypertension Father    Diabetes Maternal Grandmother    Diabetes Maternal Grandfather    Hyperlipidemia Maternal Grandfather    Diabetes Paternal Grandmother    Diabetes Paternal Grandfather    SOCIAL HISTORY Social History   Tobacco Use   Smoking status: Never   Smokeless tobacco: Never  Vaping Use   Vaping status: Never Used  Substance Use Topics   Alcohol use: No   Drug use: No       OPHTHALMIC EXAM:  Base Eye Exam     Visual Acuity (Snellen - Linear)       Right Left   Dist cc 20/20 20/25    Correction: Glasses         Tonometry (Tonopen, 8:22 AM)       Right Left   Pressure 22 22         Pupils       Pupils Dark Light Shape React APD   Right PERRL 3 2 Round Brisk None   Left PERRL 3 2 Round Brisk None         Visual Fields (Counting fingers)       Left Right    Full Full         Extraocular Movement       Right Left    Full, Ortho Full, Ortho         Neuro/Psych     Oriented x3: Yes   Mood/Affect: Normal         Dilation     Both eyes: 1.0% Mydriacyl, 2.5% Phenylephrine @ 8:23 AM           Slit Lamp and Fundus Exam     Slit Lamp Exam       Right Left   Lids/Lashes Normal Normal   Conjunctiva/Sclera mild melanosis, nasal and temporal pinguecula mild melanosis, nasal and temporal pinguecula   Cornea tear film debris, trace PEE tear film debris, trace PEE   Anterior Chamber deep and clear deep and clear   Iris Round and dilated Round and dilated   Lens 2+ Nuclear sclerosis, 2-3+ Cortical cataract 2+ Nuclear sclerosis, 2-3+ Cortical cataract   Anterior Vitreous mild syneresis mild syneresis         Fundus Exam       Right Left   Disc Pink and  Sharp, PPA Pink and Sharp, PPA   C/D Ratio 0.3 0.5   Macula Flat, Good foveal reflex, No heme or edema Flat, Good foveal reflex, RPE mottling, No heme or edema   Vessels mild attenuation mild attenuation   Periphery Attached, focal pigmented lattice with atrophic hole at 0730 and 1000, pigmented lattice  IT quad almost to ora Attached, patches of pigmented lattice at 1200, 0130, 0300, 0400 and 0600           Refraction     Manifest Refraction       Sphere Cylinder Axis   Right -1.75 +0.75 116   Left -2.00 +0.25 110           IMAGING AND PROCEDURES  Imaging and Procedures for 05/20/2023  OCT, Retina - OU - Both Eyes       Right Eye Quality was good. Central Foveal Thickness: 239. Progression has no prior data. Findings include normal foveal contour, no IRF, no SRF, vitreomacular adhesion .   Left Eye Quality was good. Central Foveal Thickness: 238. Progression has no prior data. Findings include normal foveal contour, no IRF, no SRF, vitreomacular adhesion .   Notes *Images captured and stored on drive  Diagnosis / Impression:  NFP, no IRF/SRF OU  Clinical management:  See below  Abbreviations: NFP - Normal foveal profile. CME - cystoid macular edema. PED - pigment epithelial detachment. IRF - intraretinal fluid. SRF - subretinal fluid. EZ - ellipsoid zone. ERM - epiretinal membrane. ORA - outer retinal atrophy. ORT - outer retinal tubulation. SRHM - subretinal hyper-reflective material. IRHM - intraretinal hyper-reflective material           ASSESSMENT/PLAN:   ICD-10-CM   1. Bilateral retinal lattice degeneration  H35.413 OCT, Retina - OU - Both Eyes    2. Retinal hole of both eyes  H33.323 OCT, Retina - OU - Both Eyes    3. Combined forms of age-related cataract of both eyes  H25.813      1,2. Lattice degeneration w/ atrophic holes, both eyes - OD: focal pigmented lattice with atrophic hole at 0730 and 1000, pigmented lattice IT quad almost to ora - OS:  patches of pigmented lattice at 1200, 0130, 0300, 0400 and 0600 - discussed findings, prognosis, and treatment options including observation - recommend laser retinopexy OU -- will bring pt back on Thursday, December 19th for OS  3. Mixed Cataract OU - The symptoms of cataract, surgical options, and treatments and risks were discussed with patient. - discussed diagnosis and progression - monitor   Ophthalmic Meds Ordered this visit:  No orders of the defined types were placed in this encounter.    Return in about 3 days (around 05/23/2023) for f/u lattice degeneration OU, DFE, OCT OS, laser retinopexy OS.  There are no Patient Instructions on file for this visit.  Explained the diagnoses, plan, and follow up with the patient and they expressed understanding.  Patient expressed understanding of the importance of proper follow up care.   This document serves as a record of services personally performed by Karie Chimera, MD, PhD. It was created on their behalf by Glee Arvin. Manson Passey, OA an ophthalmic technician. The creation of this record is the provider's dictation and/or activities during the visit.    Electronically signed by: Glee Arvin. Manson Passey, OA 05/20/23 4:33 PM   Karie Chimera, M.D., Ph.D. Diseases & Surgery of the Retina and Vitreous [facility] 05/20/2023  I have reviewed the above documentation for accuracy and completeness, and I agree with the above. Karie Chimera, M.D., Ph.D. 05/20/23 4:33 PM   Abbreviations: M myopia (nearsighted); A astigmatism; H hyperopia (farsighted); P presbyopia; Mrx spectacle prescription;  CTL contact lenses; OD right eye; OS left eye; OU both eyes  XT exotropia; ET esotropia; PEK punctate epithelial  keratitis; PEE punctate epithelial erosions; DES dry eye syndrome; MGD meibomian gland dysfunction; ATs artificial tears; PFAT's preservative free artificial tears; NSC nuclear sclerotic cataract; PSC posterior subcapsular  cataract; ERM epi-retinal membrane; PVD posterior vitreous detachment; RD retinal detachment; DM diabetes mellitus; DR diabetic retinopathy; NPDR non-proliferative diabetic retinopathy; PDR proliferative diabetic retinopathy; CSME clinically significant macular edema; DME diabetic macular edema; dbh dot blot hemorrhages; CWS cotton wool spot; POAG primary open angle glaucoma; C/D cup-to-disc ratio; HVF humphrey visual field; GVF goldmann visual field; OCT optical coherence tomography; IOP intraocular pressure; BRVO Branch retinal vein occlusion; CRVO central retinal vein occlusion; CRAO central retinal artery occlusion; BRAO branch retinal artery occlusion; RT retinal tear; SB scleral buckle; PPV pars plana vitrectomy; VH Vitreous hemorrhage; PRP panretinal laser photocoagulation; IVK intravitreal kenalog; VMT vitreomacular traction; MH Macular hole;  NVD neovascularization of the disc; NVE neovascularization elsewhere; AREDS age related eye disease study; ARMD age related macular degeneration; POAG primary open angle glaucoma; EBMD epithelial/anterior basement membrane dystrophy; ACIOL anterior chamber intraocular lens; IOL intraocular lens; PCIOL posterior chamber intraocular lens; Phaco/IOL phacoemulsification with intraocular lens placement; PRK photorefractive keratectomy; LASIK laser assisted in situ keratomileusis; HTN hypertension; DM diabetes mellitus; COPD chronic obstructive pulmonary disease

## 2023-05-20 ENCOUNTER — Ambulatory Visit (INDEPENDENT_AMBULATORY_CARE_PROVIDER_SITE_OTHER): Payer: Medicare PPO | Admitting: Ophthalmology

## 2023-05-20 ENCOUNTER — Encounter (INDEPENDENT_AMBULATORY_CARE_PROVIDER_SITE_OTHER): Payer: Self-pay | Admitting: Ophthalmology

## 2023-05-20 DIAGNOSIS — H33323 Round hole, bilateral: Secondary | ICD-10-CM

## 2023-05-20 DIAGNOSIS — H25813 Combined forms of age-related cataract, bilateral: Secondary | ICD-10-CM

## 2023-05-20 DIAGNOSIS — H35413 Lattice degeneration of retina, bilateral: Secondary | ICD-10-CM

## 2023-05-20 DIAGNOSIS — H3581 Retinal edema: Secondary | ICD-10-CM

## 2023-05-22 NOTE — Progress Notes (Signed)
[facility] - Clinic Note  05/23/2023   CHIEF COMPLAINT Patient presents for Retina Follow Up  HISTORY OF PRESENT ILLNESS: Toni Rodriguez is a 67 y.o. female who presents to the clinic today for:  HPI     Retina Follow Up   Patient presents with  Retinal Break/Detachment.  In left eye.  Duration of 3 days.  I, the attending physician,  performed the HPI with the patient and updated documentation appropriately.        Comments   Patient states the eyes are crusty. She is using AT's.      Last edited by Rennis Chris, MD on 05/23/2023 12:45 PM.     Pt here for laser retinopexy OS   Referring physician: Inc, [facility] [address] [place],  [place] [address]  HISTORICAL INFORMATION:  Selected notes from the MEDICAL RECORD NUMBER Referred by Dr. Bascom Levels for concern of lattice with retinal hole OS LEE:  Ocular Hx- PMH-   CURRENT MEDICATIONS: Current Outpatient Medications (Ophthalmic Drugs)  Medication Sig   prednisoLONE acetate (PRED FORTE) 1 % ophthalmic suspension Place 1 drop into the left eye 4 (four) times daily for 7 days.   No current facility-administered medications for this visit. (Ophthalmic Drugs)   Current Outpatient Medications (Other)  Medication Sig   Cyanocobalamin (VITAMIN B-12 PO) Take 1 tablet by mouth daily.   ibuprofen (ADVIL) 800 MG tablet Take 1 tablet (800 mg total) by mouth every 8 (eight) hours as needed for moderate pain.   Liniments (SALONPAS PAIN RELIEF PATCH EX) Apply 1 patch topically daily as needed (for pain).   loperamide (IMODIUM) 2 MG capsule Take 2 capsules (4 mg total) by mouth 2 (two) times daily as needed for diarrhea or loose stools.   ondansetron (ZOFRAN-ODT) 8 MG disintegrating tablet Take 1 tablet (8 mg total) by mouth every 8 (eight) hours as needed for nausea or vomiting.   oxybutynin (DITROPAN) 5 MG tablet Take 1 tablet (5 mg total) by mouth 3 (three) times daily.    tiZANidine (ZANAFLEX) 4 MG capsule Take 1 capsule (4 mg total) by mouth 3 (three) times daily.   No current facility-administered medications for this visit. (Other)   REVIEW OF SYSTEMS: ROS   Positive for: Eyes, Allergic/Imm Negative for: Constitutional, Gastrointestinal, Neurological, Skin, Genitourinary, Musculoskeletal, HENT, Endocrine, Cardiovascular, Respiratory, Psychiatric, Heme/Lymph Last edited by Charlette Caffey, COT on 05/23/2023 12:22 PM.      ALLERGIES Allergies  Allergen Reactions   Penicillins Hives and Other (See Comments)    Has patient had a PCN reaction causing immediate rash, facial/tongue/throat swelling, SOB or lightheadedness with hypotension: Yes Has patient had a PCN reaction causing severe rash involving mucus membranes or skin necrosis: No Has patient had a PCN reaction that required hospitalization: No Has patient had a PCN reaction occurring within the last 10 years: No If all of the above answers are "NO", then may proceed with Cephalosporin use.    PAST MEDICAL HISTORY Past Medical History:  Diagnosis Date   UTI (lower urinary tract infection)    Past Surgical History:  Procedure Laterality Date   ABDOMINAL HYSTERECTOMY     BREAST BIOPSY Left 10/2015   BREAST BIOPSY Right 10/2015   benign   COLONOSCOPY WITH PROPOFOL N/A 06/03/2017   Procedure: COLONOSCOPY WITH PROPOFOL;  Surgeon: Kerin Salen, MD;  Location: [facility];  Service: Gastroenterology;  Laterality: N/A;   FLEXIBLE SIGMOIDOSCOPY N/A 06/25/2017   Procedure: FLEXIBLE  SIGMOIDOSCOPY;  Surgeon: Kerin Salen, MD;  Location: [facility];  Service: Gastroenterology;  Laterality: N/A;   TONSILLECTOMY     FAMILY HISTORY Family History  Problem Relation Age of Onset   Hypertension Mother    Cataracts Mother    Diabetes Father    Hypertension Father    Diabetes Maternal Grandmother    Diabetes Maternal Grandfather    Hyperlipidemia Maternal Grandfather    Diabetes Paternal  Grandmother    Diabetes Paternal Grandfather    SOCIAL HISTORY Social History   Tobacco Use   Smoking status: Never   Smokeless tobacco: Never  Vaping Use   Vaping status: Never Used  Substance Use Topics   Alcohol use: No   Drug use: No       OPHTHALMIC EXAM:  Base Eye Exam     Visual Acuity (Snellen - Linear)       Right Left   Dist cc 20/25 20/20   Dist ph cc NI     Correction: Contacts         Tonometry (Tonopen, 12:26 PM)       Right Left   Pressure 14 20         Pupils       Dark Light Shape React APD   Right 3 2 Round Brisk None   Left 3 2 Round Brisk None         Visual Fields       Left Right    Full Full         Extraocular Movement       Right Left    Full, Ortho Full, Ortho         Neuro/Psych     Oriented x3: Yes   Mood/Affect: Normal         Dilation     Both eyes: 1.0% Mydriacyl, 2.5% Phenylephrine @ 12:23 PM           Slit Lamp and Fundus Exam     Slit Lamp Exam       Right Left   Lids/Lashes Normal Normal   Conjunctiva/Sclera mild melanosis, nasal and temporal pinguecula mild melanosis, nasal and temporal pinguecula   Cornea tear film debris, trace PEE tear film debris, trace PEE   Anterior Chamber deep and clear deep and clear   Iris Round and dilated Round and dilated   Lens 2+ Nuclear sclerosis, 2-3+ Cortical cataract 2+ Nuclear sclerosis, 2-3+ Cortical cataract   Anterior Vitreous mild syneresis mild syneresis         Fundus Exam       Right Left   Disc Pink and Sharp, PPA Pink and Sharp, PPA   C/D Ratio 0.3 0.5   Macula Flat, Good foveal reflex, No heme or edema Flat, Good foveal reflex, RPE mottling, No heme or edema   Vessels mild attenuation mild attenuation   Periphery Attached, focal pigmented lattice with atrophic hole at 0730 and 1000, pigmented lattice IT quad almost to ora Attached, patches of pigmented lattice at 1200, 0130, 0300, 0400 and 0600           IMAGING AND  PROCEDURES  Imaging and Procedures for 05/23/2023  OCT, Retina - OU - Both Eyes       Right Eye Quality was good. Central Foveal Thickness: 233. Progression has been stable. Findings include normal foveal contour, no IRF, no SRF, vitreomacular adhesion .   Left Eye Quality was good. Central Foveal Thickness: 234. Progression has been stable.  Findings include normal foveal contour, no IRF, no SRF, vitreomacular adhesion .   Notes *Images captured and stored on drive  Diagnosis / Impression:  NFP, no IRF/SRF OU  Clinical management:  See below  Abbreviations: NFP - Normal foveal profile. CME - cystoid macular edema. PED - pigment epithelial detachment. IRF - intraretinal fluid. SRF - subretinal fluid. EZ - ellipsoid zone. ERM - epiretinal membrane. ORA - outer retinal atrophy. ORT - outer retinal tubulation. SRHM - subretinal hyper-reflective material. IRHM - intraretinal hyper-reflective material      Repair Retinal Breaks, Laser - OS - Left Eye       LASER PROCEDURE NOTE  Procedure:  Barrier laser retinopexy using slit lamp laser, LEFT eye   Diagnosis:   Lattice degeneration w/ atrophic holes, LEFT eye                     Patches of lattice: from 12-6 oclock temporal periphery  Surgeon: Rennis Chris, MD, PhD  Anesthesia: Topical  Informed consent obtained, operative eye marked, and time out performed prior to initiation of laser.   Laser settings:  Lumenis Smart532 laser, slit lamp Lens: Mainster PRP 165 Power: 320 mW Spot size: 200 microns Duration: 30 msec  # spots: 740  Placement of laser: Using a Mainster PRP 165 contact lens at the slit lamp, laser was placed in three confluent rows around patches of lattice w/ atrophic holes spanning 12-6 oclock periphery.  Complications: None.  Patient tolerated the procedure well and received written and verbal post-procedure care information/education.          ASSESSMENT/PLAN:   ICD-10-CM   1. Bilateral retinal  lattice degeneration  H35.413 OCT, Retina - OU - Both Eyes    Repair Retinal Breaks, Laser - OS - Left Eye    2. Retinal hole of both eyes  H33.323 Repair Retinal Breaks, Laser - OS - Left Eye    3. Combined forms of age-related cataract of both eyes  H25.813       1,2. Lattice degeneration w/ atrophic holes, both eyes - OD: focal pigmented lattice with atrophic hole at 0730 and 1000, pigmented lattice IT quad almost to ora - OS: patches of pigmented lattice spanning 1200 to 0600 temporal periphery - recommend laser retinopexy OS today, 12.19.24 - pt wishes to proceed with laser - RBA of procedure discussed, questions answered - informed consent obtained and signed - see procedure note - f/u January 15th, DFE, OCT, possible laser retinopexy OD  3. Mixed Cataract OU - The symptoms of cataract, surgical options, and treatments and risks were discussed with patient. - discussed diagnosis and progression - monitor  Ophthalmic Meds Ordered this visit:  Meds ordered this encounter  Medications   prednisoLONE acetate (PRED FORTE) 1 % ophthalmic suspension    Sig: Place 1 drop into the left eye 4 (four) times daily for 7 days.    Dispense:  10 mL    Refill:  0     Return in about 27 days (around 06/19/2023) for lattice OU, Dilated Exam, OCT, Laser.  There are no Patient Instructions on file for this visit.  Explained the diagnoses, plan, and follow up with the patient and they expressed understanding.  Patient expressed understanding of the importance of proper follow up care.   This document serves as a record of services personally performed by Karie Chimera, MD, PhD. It was created on their behalf by Annalee Genta, COMT. The creation of this record is  the provider's dictation and/or activities during the visit.  Electronically signed by: Annalee Genta, COMT 05/23/23 2:07 PM  This document serves as a record of services personally performed by Karie Chimera, MD, PhD. It was  created on their behalf by Glee Arvin. Manson Passey, OA an ophthalmic technician. The creation of this record is the provider's dictation and/or activities during the visit.    Electronically signed by: Glee Arvin. Manson Passey, OA 05/23/23 2:07 PM  Karie Chimera, M.D., Ph.D. Diseases & Surgery of the Retina and Vitreous [facility] 05/23/2023  I have reviewed the above documentation for accuracy and completeness, and I agree with the above. Karie Chimera, M.D., Ph.D. 05/23/23 2:08 PM  Abbreviations: M myopia (nearsighted); A astigmatism; H hyperopia (farsighted); P presbyopia; Mrx spectacle prescription;  CTL contact lenses; OD right eye; OS left eye; OU both eyes  XT exotropia; ET esotropia; PEK punctate epithelial keratitis; PEE punctate epithelial erosions; DES dry eye syndrome; MGD meibomian gland dysfunction; ATs artificial tears; PFAT's preservative free artificial tears; NSC nuclear sclerotic cataract; PSC posterior subcapsular cataract; ERM epi-retinal membrane; PVD posterior vitreous detachment; RD retinal detachment; DM diabetes mellitus; DR diabetic retinopathy; NPDR non-proliferative diabetic retinopathy; PDR proliferative diabetic retinopathy; CSME clinically significant macular edema; DME diabetic macular edema; dbh dot blot hemorrhages; CWS cotton wool spot; POAG primary open angle glaucoma; C/D cup-to-disc ratio; HVF humphrey visual field; GVF goldmann visual field; OCT optical coherence tomography; IOP intraocular pressure; BRVO Branch retinal vein occlusion; CRVO central retinal vein occlusion; CRAO central retinal artery occlusion; BRAO branch retinal artery occlusion; RT retinal tear; SB scleral buckle; PPV pars plana vitrectomy; VH Vitreous hemorrhage; PRP panretinal laser photocoagulation; IVK intravitreal kenalog; VMT vitreomacular traction; MH Macular hole;  NVD neovascularization of the disc; NVE neovascularization elsewhere; AREDS age related eye disease study; ARMD  age related macular degeneration; POAG primary open angle glaucoma; EBMD epithelial/anterior basement membrane dystrophy; ACIOL anterior chamber intraocular lens; IOL intraocular lens; PCIOL posterior chamber intraocular lens; Phaco/IOL phacoemulsification with intraocular lens placement; PRK photorefractive keratectomy; LASIK laser assisted in situ keratomileusis; HTN hypertension; DM diabetes mellitus; COPD chronic obstructive pulmonary disease

## 2023-05-23 ENCOUNTER — Ambulatory Visit (INDEPENDENT_AMBULATORY_CARE_PROVIDER_SITE_OTHER): Payer: Medicare PPO | Admitting: Ophthalmology

## 2023-05-23 ENCOUNTER — Encounter (INDEPENDENT_AMBULATORY_CARE_PROVIDER_SITE_OTHER): Payer: Self-pay | Admitting: Ophthalmology

## 2023-05-23 DIAGNOSIS — H33323 Round hole, bilateral: Secondary | ICD-10-CM | POA: Diagnosis not present

## 2023-05-23 DIAGNOSIS — H35413 Lattice degeneration of retina, bilateral: Secondary | ICD-10-CM

## 2023-05-23 DIAGNOSIS — H25813 Combined forms of age-related cataract, bilateral: Secondary | ICD-10-CM

## 2023-05-23 MED ORDER — PREDNISOLONE ACETATE 1 % OP SUSP: 1.0000 [drp] | Freq: Four times a day (QID) | OPHTHALMIC | 0 refills | Status: AC

## 2023-05-24 ENCOUNTER — Ambulatory Visit
Admission: RE | Admit: 2023-05-24 | Discharge: 2023-05-24 | Disposition: A | Discharge status: Home / Self Care | Payer: Medicare PPO | Source: Ambulatory Visit | Attending: Nurse Practitioner | Admitting: Nurse Practitioner

## 2023-05-24 DIAGNOSIS — Z1231 Encounter for screening mammogram for malignant neoplasm of breast: Secondary | ICD-10-CM

## 2023-06-03 ENCOUNTER — Other Ambulatory Visit: Payer: Self-pay | Admitting: Nurse Practitioner

## 2023-06-03 DIAGNOSIS — R928 Other abnormal and inconclusive findings on diagnostic imaging of breast: Secondary | ICD-10-CM

## 2023-06-13 ENCOUNTER — Other Ambulatory Visit: Payer: Self-pay | Admitting: Nurse Practitioner

## 2023-06-13 ENCOUNTER — Ambulatory Visit
Admission: RE | Admit: 2023-06-13 | Discharge: 2023-06-13 | Disposition: A | Discharge status: Home / Self Care | Payer: Medicare Other | Source: Ambulatory Visit | Attending: Nurse Practitioner | Admitting: Nurse Practitioner

## 2023-06-13 DIAGNOSIS — R921 Mammographic calcification found on diagnostic imaging of breast: Secondary | ICD-10-CM

## 2023-06-13 DIAGNOSIS — R928 Other abnormal and inconclusive findings on diagnostic imaging of breast: Secondary | ICD-10-CM

## 2023-06-17 ENCOUNTER — Ambulatory Visit
Admission: RE | Admit: 2023-06-17 | Discharge: 2023-06-17 | Disposition: A | Discharge status: Home / Self Care | Payer: Medicare Other | Source: Ambulatory Visit | Attending: Nurse Practitioner | Admitting: Nurse Practitioner

## 2023-06-17 DIAGNOSIS — R921 Mammographic calcification found on diagnostic imaging of breast: Secondary | ICD-10-CM

## 2023-06-17 HISTORY — PX: BREAST BIOPSY: SHX20

## 2023-06-17 NOTE — Progress Notes (Signed)
 [facility] - Clinic Note  06/19/2023   CHIEF COMPLAINT Patient presents for Retina Follow Up  HISTORY OF PRESENT ILLNESS: Toni Rodriguez is a 68 y.o. female who presents to the clinic today for:  HPI     Retina Follow Up   In both eyes.  This started 27 days ago.  Duration of 27 days.  Since onset it is stable.  I, the attending physician,  performed the HPI with the patient and updated documentation appropriately.        Comments   27 day follow up lattice and pexy OD pt is reporting no vision changes noticed she denies flashes or floaters pt is using latanoprost ou at bedtime        Last edited by Ronelle Coffee, MD on 06/19/2023  4:30 PM.    Pt here for laser retinopexy OD   Referring physician: [facility], [facility] [address] [place],  [place] [address]  HISTORICAL INFORMATION:  Selected notes from the MEDICAL RECORD NUMBER Referred by Dr. Micael Adas for concern of lattice with retinal hole OS LEE:  Ocular Hx- PMH-   CURRENT MEDICATIONS: Current Outpatient Medications (Ophthalmic Drugs)  Medication Sig   prednisoLONE  acetate (PRED FORTE ) 1 % ophthalmic suspension Place 1 drop into the right eye 4 (four) times daily for 7 days.   No current facility-administered medications for this visit. (Ophthalmic Drugs)   Current Outpatient Medications (Other)  Medication Sig   Cyanocobalamin (VITAMIN B-12 PO) Take 1 tablet by mouth daily.   ibuprofen  (ADVIL ) 800 MG tablet Take 1 tablet (800 mg total) by mouth every 8 (eight) hours as needed for moderate pain.   Liniments (SALONPAS PAIN RELIEF PATCH EX) Apply 1 patch topically daily as needed (for pain).   loperamide  (IMODIUM ) 2 MG capsule Take 2 capsules (4 mg total) by mouth 2 (two) times daily as needed for diarrhea or loose stools.   ondansetron  (ZOFRAN -ODT) 8 MG disintegrating tablet Take 1 tablet (8 mg total) by mouth every 8 (eight) hours as needed for nausea or vomiting.    oxybutynin  (DITROPAN ) 5 MG tablet Take 1 tablet (5 mg total) by mouth 3 (three) times daily.   tiZANidine  (ZANAFLEX ) 4 MG capsule Take 1 capsule (4 mg total) by mouth 3 (three) times daily.   No current facility-administered medications for this visit. (Other)   REVIEW OF SYSTEMS: ROS   Positive for: Eyes, Allergic/Imm Negative for: Constitutional, Gastrointestinal, Neurological, Skin, Genitourinary, Musculoskeletal, HENT, Endocrine, Cardiovascular, Respiratory, Psychiatric, Heme/Lymph Last edited by Alise Appl, COT on 06/19/2023  2:23 PM.       ALLERGIES Allergies  Allergen Reactions   Penicillins Hives and Other (See Comments)    Has patient had a PCN reaction causing immediate rash, facial/tongue/throat swelling, SOB or lightheadedness with hypotension: Yes Has patient had a PCN reaction causing severe rash involving mucus membranes or skin necrosis: No Has patient had a PCN reaction that required hospitalization: No Has patient had a PCN reaction occurring within the last 10 years: No If all of the above answers are "NO", then may proceed with Cephalosporin use.    PAST MEDICAL HISTORY Past Medical History:  Diagnosis Date   UTI (lower urinary tract infection)    Past Surgical History:  Procedure Laterality Date   ABDOMINAL HYSTERECTOMY     BREAST BIOPSY Left 10/2015   BREAST BIOPSY Right 10/2015   benign   BREAST BIOPSY Left 06/17/2023   MM LT BREAST BX  W LOC DEV 1ST LESION IMAGE BX SPEC STEREO GUIDE 06/17/2023 [facility]   COLONOSCOPY WITH PROPOFOL  N/A 06/03/2017   Procedure: COLONOSCOPY WITH PROPOFOL ;  Surgeon: Genell Ken, MD;  Location: [facility];  Service: Gastroenterology;  Laterality: N/A;   FLEXIBLE SIGMOIDOSCOPY N/A 06/25/2017   Procedure: FLEXIBLE SIGMOIDOSCOPY;  Surgeon: Genell Ken, MD;  Location: [facility];  Service: Gastroenterology;  Laterality: N/A;   TONSILLECTOMY     FAMILY HISTORY Family History  Problem Relation Age of Onset    Hypertension Mother    Cataracts Mother    Diabetes Father    Hypertension Father    Diabetes Maternal Grandmother    Diabetes Maternal Grandfather    Hyperlipidemia Maternal Grandfather    Diabetes Paternal Grandmother    Diabetes Paternal Grandfather    SOCIAL HISTORY Social History   Tobacco Use   Smoking status: Never   Smokeless tobacco: Never  Vaping Use   Vaping status: Never Used  Substance Use Topics   Alcohol use: No   Drug use: No       OPHTHALMIC EXAM:  Base Eye Exam     Visual Acuity (Snellen - Linear)       Right Left   Dist cc 20/25 -2 20/25   Dist ph cc NI NI         Tonometry (Tonopen, 2:29 PM)       Right Left   Pressure 18 19         Pupils       Pupils Dark Light Shape React APD   Right PERRL 3 2 Round Brisk None   Left PERRL 3 2 Round Brisk None         Visual Fields       Left Right    Full Full         Extraocular Movement       Right Left    Full, Ortho Full, Ortho         Neuro/Psych     Oriented x3: Yes   Mood/Affect: Normal         Dilation     Right eye: 2.5% Phenylephrine @ 2:29 PM           Slit Lamp and Fundus Exam     Slit Lamp Exam       Right Left   Lids/Lashes Normal Normal   Conjunctiva/Sclera mild melanosis, nasal and temporal pinguecula mild melanosis, nasal and temporal pinguecula   Cornea tear film debris, trace PEE tear film debris, trace PEE   Anterior Chamber deep and clear deep and clear   Iris Round and dilated Round and dilated   Lens 2+ Nuclear sclerosis, 2-3+ Cortical cataract 2+ Nuclear sclerosis, 2-3+ Cortical cataract   Anterior Vitreous mild syneresis mild syneresis         Fundus Exam       Right Left   Disc Pink and Sharp, PPA Pink and Sharp, PPA   C/D Ratio 0.3 0.5   Macula Flat, Good foveal reflex, No heme or edema Flat, Good foveal reflex, RPE mottling, No heme or edema   Vessels mild attenuation mild attenuation   Periphery Attached, patches of  lattice with atrophic holes from 1030 to 0100 oclock; pigmented lattice at 0730 and pigmented tuft at 1000 Attached, patches of pigmented lattice at 1200, 0130, 0300, 0400 and 0600           IMAGING AND PROCEDURES  Imaging and Procedures for 06/19/2023  OCT, Retina -  OU - Both Eyes       Right Eye Quality was good. Central Foveal Thickness: 235. Progression has been stable. Findings include normal foveal contour, no IRF, no SRF, vitreomacular adhesion .   Left Eye Quality was good. Central Foveal Thickness: 240. Progression has been stable. Findings include normal foveal contour, no IRF, no SRF, vitreomacular adhesion .   Notes *Images captured and stored on drive  Diagnosis / Impression:  NFP, no IRF/SRF OU  Clinical management:  See below  Abbreviations: NFP - Normal foveal profile. CME - cystoid macular edema. PED - pigment epithelial detachment. IRF - intraretinal fluid. SRF - subretinal fluid. EZ - ellipsoid zone. ERM - epiretinal membrane. ORA - outer retinal atrophy. ORT - outer retinal tubulation. SRHM - subretinal hyper-reflective material. IRHM - intraretinal hyper-reflective material      Repair Retinal Breaks, Laser - OD - Right Eye       LASER PROCEDURE NOTE  Procedure:  Barrier laser retinopexy using slit lamp laser, RIGHT eye   Diagnosis:   Lattice degeneration w/ atrophic holes, RIGHT eye                     Patches of lattice: 1030-0100 superiorly; 0730 inferiorly                         Pigmented VR tuft at 1000  Surgeon: Ronelle Coffee, MD, PhD  Anesthesia: Topical  Informed consent obtained, operative eye marked, and time out performed prior to initiation of laser.   Laser settings:  Lumenis Smart532 laser, slit lamp Lens: Mainster PRP 165 Power: 270 mW Spot size: 200 microns Duration: 30 msec  # spots: 720  Placement of laser: Using a Mainster PRP 165 contact lens at the slit lamp, laser was placed in three confluent rows around patches of  lattice w/ atrophic holes 1030-0100 oclock, pigmented lattice at 0730, and pigmented VR tuft at 1000, anterior to equator.  Complications: None.  Patient tolerated the procedure well and received written and verbal post-procedure care information/education.            ASSESSMENT/PLAN:   ICD-10-CM   1. Bilateral retinal lattice degeneration  H35.413 OCT, Retina - OU - Both Eyes    Repair Retinal Breaks, Laser - OD - Right Eye    2. Retinal hole of both eyes  H33.323 OCT, Retina - OU - Both Eyes    Repair Retinal Breaks, Laser - OD - Right Eye    3. Combined forms of age-related cataract of both eyes  H25.813       1,2. Lattice degeneration w/ atrophic holes, both eyes - OD: patches of lattice with atrophic holes from 1030 to 0100 oclock; pigmented lattice at 0730 and pigmented tuft at 1000 - OS: patches of pigmented lattice spanning 1200 to 0600 temporal periphery - s/p laser retinopexy OS (12.19.24) - recommend laser retinopexy OD today, 12.15.25 - pt wishes to proceed with laser - RBA of procedure discussed, questions answered - informed consent obtained and signed - see procedure note - start PF QID OD x7 days - f/u 3-4 weeks, DFE, OCT  3. Mixed Cataract OU - The symptoms of cataract, surgical options, and treatments and risks were discussed with patient. - discussed diagnosis and progression - monitor  Ophthalmic Meds Ordered this visit:  Meds ordered this encounter  Medications   prednisoLONE  acetate (PRED FORTE ) 1 % ophthalmic suspension    Sig: Place 1 drop into  the right eye 4 (four) times daily for 7 days.    Dispense:  1.4 mL    Refill:  0     Return in about 4 weeks (around 07/17/2023) for f/u Lattice degen s/p laser OU - DFE, OCT.  There are no Patient Instructions on file for this visit.  Explained the diagnoses, plan, and follow up with the patient and they expressed understanding.  Patient expressed understanding of the importance of proper follow up  care.   This document serves as a record of services personally performed by Jeanice Millard, MD, PhD. It was created on their behalf by Morley Arabia. Bevin Bucks, OA an ophthalmic technician. The creation of this record is the provider's dictation and/or activities during the visit.    Electronically signed by: Morley Arabia. Bevin Bucks, OA 06/19/23 4:51 PM  Jeanice Millard, M.D., Ph.D. Diseases & Surgery of the Retina and Vitreous [facility] 06/19/2023  I have reviewed the above documentation for accuracy and completeness, and I agree with the above. Jeanice Millard, M.D., Ph.D. 06/19/23 4:52 PM  Abbreviations: M myopia (nearsighted); A astigmatism; H hyperopia (farsighted); P presbyopia; Mrx spectacle prescription;  CTL contact lenses; OD right eye; OS left eye; OU both eyes  XT exotropia; ET esotropia; PEK punctate epithelial keratitis; PEE punctate epithelial erosions; DES dry eye syndrome; MGD meibomian gland dysfunction; ATs artificial tears; PFAT's preservative free artificial tears; NSC nuclear sclerotic cataract; PSC posterior subcapsular cataract; ERM epi-retinal membrane; PVD posterior vitreous detachment; RD retinal detachment; DM diabetes mellitus; DR diabetic retinopathy; NPDR non-proliferative diabetic retinopathy; PDR proliferative diabetic retinopathy; CSME clinically significant macular edema; DME diabetic macular edema; dbh dot blot hemorrhages; CWS cotton wool spot; POAG primary open angle glaucoma; C/D cup-to-disc ratio; HVF humphrey visual field; GVF goldmann visual field; OCT optical coherence tomography; IOP intraocular pressure; BRVO Branch retinal vein occlusion; CRVO central retinal vein occlusion; CRAO central retinal artery occlusion; BRAO branch retinal artery occlusion; RT retinal tear; SB scleral buckle; PPV pars plana vitrectomy; VH Vitreous hemorrhage; PRP panretinal laser photocoagulation; IVK intravitreal kenalog ; VMT vitreomacular traction; MH Macular hole;  NVD  neovascularization of the disc; NVE neovascularization elsewhere; AREDS age related eye disease study; ARMD age related macular degeneration; POAG primary open angle glaucoma; EBMD epithelial/anterior basement membrane dystrophy; ACIOL anterior chamber intraocular lens; IOL intraocular lens; PCIOL posterior chamber intraocular lens; Phaco/IOL phacoemulsification with intraocular lens placement; PRK photorefractive keratectomy; LASIK laser assisted in situ keratomileusis; HTN hypertension; DM diabetes mellitus; COPD chronic obstructive pulmonary disease

## 2023-06-18 LAB — SURGICAL PATHOLOGY

## 2023-06-19 ENCOUNTER — Ambulatory Visit (INDEPENDENT_AMBULATORY_CARE_PROVIDER_SITE_OTHER): Payer: Medicare Other | Admitting: Ophthalmology

## 2023-06-19 ENCOUNTER — Encounter (INDEPENDENT_AMBULATORY_CARE_PROVIDER_SITE_OTHER): Payer: Self-pay | Admitting: Ophthalmology

## 2023-06-19 DIAGNOSIS — H35413 Lattice degeneration of retina, bilateral: Secondary | ICD-10-CM

## 2023-06-19 DIAGNOSIS — H25813 Combined forms of age-related cataract, bilateral: Secondary | ICD-10-CM

## 2023-06-19 DIAGNOSIS — H33323 Round hole, bilateral: Secondary | ICD-10-CM

## 2023-06-19 MED ORDER — PREDNISOLONE ACETATE 1 % OP SUSP: 1.0000 [drp] | Freq: Four times a day (QID) | OPHTHALMIC | 0 refills | Status: AC

## 2023-06-27 ENCOUNTER — Other Ambulatory Visit: Payer: Self-pay

## 2023-06-28 NOTE — Progress Notes (Signed)
 [facility] - Clinic Note  07/10/2023   CHIEF COMPLAINT Patient presents for Retina Follow Up  HISTORY OF PRESENT ILLNESS: Toni Rodriguez is a 68 y.o. female who presents to the clinic today for:  HPI     Retina Follow Up   Patient presents with  Other.  In both eyes.  This started 3 weeks ago.  I, the attending physician,  performed the HPI with the patient and updated documentation appropriately.        Comments   Patient here for 3 weeks retina follow up for lattice degeneration OU. Patient state vision been doing good. No eye pain. Uses Teal top drop QHS OU. Missed last night.      Last edited by Valdemar Rogue, MD on 07/10/2023 12:30 PM.    Pt states she did not have any problems after the laser procedures   Referring physician: Inc, [facility] [address] [place],  [place] [address]  HISTORICAL INFORMATION:  Selected notes from the MEDICAL RECORD NUMBER Referred by Dr. Vivian for concern of lattice with retinal hole OS LEE:  Ocular Hx- PMH-   CURRENT MEDICATIONS: No current outpatient medications on file. (Ophthalmic Drugs)   No current facility-administered medications for this visit. (Ophthalmic Drugs)   Current Outpatient Medications (Other)  Medication Sig   ibuprofen  (ADVIL ) 800 MG tablet Take 1 tablet (800 mg total) by mouth every 8 (eight) hours as needed for moderate pain.   Liniments (SALONPAS PAIN RELIEF PATCH EX) Apply 1 patch topically daily as needed (for pain).   oxybutynin  (DITROPAN ) 5 MG tablet Take 1 tablet (5 mg total) by mouth 3 (three) times daily.   Cyanocobalamin (VITAMIN B-12 PO) Take 1 tablet by mouth daily.   loperamide  (IMODIUM ) 2 MG capsule Take 2 capsules (4 mg total) by mouth 2 (two) times daily as needed for diarrhea or loose stools.   ondansetron  (ZOFRAN -ODT) 8 MG disintegrating tablet Take 1 tablet (8 mg total) by mouth every 8 (eight) hours as needed for nausea or vomiting.   tiZANidine   (ZANAFLEX ) 4 MG capsule Take 1 capsule (4 mg total) by mouth 3 (three) times daily.   No current facility-administered medications for this visit. (Other)   REVIEW OF SYSTEMS: ROS   Positive for: Eyes, Allergic/Imm Negative for: Constitutional, Gastrointestinal, Neurological, Skin, Genitourinary, Musculoskeletal, HENT, Endocrine, Cardiovascular, Respiratory, Psychiatric, Heme/Lymph Last edited by Orval Asberry RAMAN, COA on 07/10/2023  9:48 AM.     ALLERGIES Allergies  Allergen Reactions   Penicillins Hives and Other (See Comments)    Has patient had a PCN reaction causing immediate rash, facial/tongue/throat swelling, SOB or lightheadedness with hypotension: Yes Has patient had a PCN reaction causing severe rash involving mucus membranes or skin necrosis: No Has patient had a PCN reaction that required hospitalization: No Has patient had a PCN reaction occurring within the last 10 years: No If all of the above answers are NO, then may proceed with Cephalosporin use.    PAST MEDICAL HISTORY Past Medical History:  Diagnosis Date   UTI (lower urinary tract infection)    Past Surgical History:  Procedure Laterality Date   ABDOMINAL HYSTERECTOMY     BREAST BIOPSY Left 10/2015   BREAST BIOPSY Right 10/2015   benign   BREAST BIOPSY Left 06/17/2023   MM LT BREAST BX W LOC DEV 1ST LESION IMAGE BX SPEC STEREO GUIDE 06/17/2023 [facility]   COLONOSCOPY WITH PROPOFOL  N/A 06/03/2017   Procedure: COLONOSCOPY WITH  PROPOFOL ;  Surgeon: Saintclair Jasper, MD;  Location: [facility];  Service: Gastroenterology;  Laterality: N/A;   FLEXIBLE SIGMOIDOSCOPY N/A 06/25/2017   Procedure: FLEXIBLE SIGMOIDOSCOPY;  Surgeon: Saintclair Jasper, MD;  Location: [facility];  Service: Gastroenterology;  Laterality: N/A;   TONSILLECTOMY     FAMILY HISTORY Family History  Problem Relation Age of Onset   Hypertension Mother    Cataracts Mother    Diabetes Father    Hypertension Father    Diabetes Maternal  Grandmother    Diabetes Maternal Grandfather    Hyperlipidemia Maternal Grandfather    Diabetes Paternal Grandmother    Diabetes Paternal Grandfather    SOCIAL HISTORY Social History   Tobacco Use   Smoking status: Never   Smokeless tobacco: Never  Vaping Use   Vaping status: Never Used  Substance Use Topics   Alcohol use: No   Drug use: No       OPHTHALMIC EXAM:  Base Eye Exam     Visual Acuity (Snellen - Linear)       Right Left   Dist cc 20/20 20/20    Correction: Glasses         Tonometry (Tonopen, 9:45 AM)       Right Left   Pressure 21 22         Pupils       Dark Light Shape React APD   Right 3 2 Round Brisk None   Left 3 2 Round Brisk None         Visual Fields (Counting fingers)       Left Right    Full Full         Extraocular Movement       Right Left    Full, Ortho Full, Ortho         Neuro/Psych     Oriented x3: Yes   Mood/Affect: Normal         Dilation     Both eyes: 1.0% Mydriacyl, 2.5% Phenylephrine @ 9:45 AM           Slit Lamp and Fundus Exam     Slit Lamp Exam       Right Left   Lids/Lashes Normal Normal   Conjunctiva/Sclera mild melanosis, nasal and temporal pinguecula mild melanosis, nasal and temporal pinguecula   Cornea tear film debris, trace PEE tear film debris, trace PEE   Anterior Chamber deep and clear deep and clear   Iris Round and dilated Round and dilated   Lens 2+ Nuclear sclerosis, 2-3+ Cortical cataract 2+ Nuclear sclerosis, 2-3+ Cortical cataract   Anterior Vitreous mild syneresis mild syneresis         Fundus Exam       Right Left   Disc Pink and Sharp, PPA Pink and Sharp, PPP   C/D Ratio 0.3 0.5   Macula Flat, Good foveal reflex, No heme or edema Flat, Good foveal reflex, RPE mottling, No heme or edema   Vessels attenuated, mild tortuosity attenuated, mild tortuosity   Periphery Attached, patches of lattice with atrophic holes from 1030 to 0100 oclock; pigmented lattice  at 0730 and pigmented tuft at 1000 -- good laser changes surrounding all lesions, no new RT/RD or lattice Attached, patches of pigmented lattice at 1200, 0130, 0300, 0400 and 0600 -- good laser changes surrounding all lesions, no new RT/RD or lattice           Refraction     Wearing Rx  Sphere Cylinder Axis Add   Right -2.00 +0.50 134 +0.00   Left -3.00 +0.25 121 +0.00           IMAGING AND PROCEDURES  Imaging and Procedures for 07/10/2023  OCT, Retina - OU - Both Eyes       Right Eye Quality was good. Central Foveal Thickness: 238. Progression has been stable. Findings include normal foveal contour, no IRF, no SRF, vitreomacular adhesion .   Left Eye Quality was good. Central Foveal Thickness: 240. Progression has been stable. Findings include normal foveal contour, no IRF, no SRF, vitreomacular adhesion .   Notes *Images captured and stored on drive  Diagnosis / Impression:  NFP, no IRF/SRF OU  Clinical management:  See below  Abbreviations: NFP - Normal foveal profile. CME - cystoid macular edema. PED - pigment epithelial detachment. IRF - intraretinal fluid. SRF - subretinal fluid. EZ - ellipsoid zone. ERM - epiretinal membrane. ORA - outer retinal atrophy. ORT - outer retinal tubulation. SRHM - subretinal hyper-reflective material. IRHM - intraretinal hyper-reflective material            ASSESSMENT/PLAN:   ICD-10-CM   1. Bilateral retinal lattice degeneration  H35.413 OCT, Retina - OU - Both Eyes    2. Retinal hole of both eyes  H33.323 OCT, Retina - OU - Both Eyes    3. Combined forms of age-related cataract of both eyes  H25.813      1,2. Lattice degeneration w/ atrophic holes, both eyes - OD: patches of lattice with atrophic holes from 1030 to 0100 oclock; pigmented lattice at 0730 and pigmented tuft at 1000 - OS: patches of pigmented lattice spanning 1200 to 0600 temporal periphery - s/p laser retinopexy OS (12.19.24) -- good laser changes  surrounding all lesions - s/p laser retinopexy OD (12.15.25) -- good laser changes surrounding all lesions - f/u 3 months, DFE, OCT  3. Mixed Cataract OU - The symptoms of cataract, surgical options, and treatments and risks were discussed with patient. - discussed diagnosis and progression - monitor  Ophthalmic Meds Ordered this visit:  No orders of the defined types were placed in this encounter.    Return in about 3 months (around 10/07/2023) for f/(u lattice degeneration OU, DFE, OCT.  There are no Patient Instructions on file for this visit.  Explained the diagnoses, plan, and follow up with the patient and they expressed understanding.  Patient expressed understanding of the importance of proper follow up care.   This document serves as a record of services personally performed by Redell JUDITHANN Hans, MD, PhD. It was created on their behalf by Alan PARAS. Delores, OA an ophthalmic technician. The creation of this record is the provider's dictation and/or activities during the visit.    Electronically signed by: Alan PARAS. Delores BIHARI 07/10/23 12:31 PM  Redell JUDITHANN Hans, M.D., Ph.D. Diseases & Surgery of the Retina and Vitreous [facility] 07/10/2023  I have reviewed the above documentation for accuracy and completeness, and I agree with the above. Redell JUDITHANN Hans, M.D., Ph.D. 07/10/23 12:31 PM   Abbreviations: M myopia (nearsighted); A astigmatism; H hyperopia (farsighted); P presbyopia; Mrx spectacle prescription;  CTL contact lenses; OD right eye; OS left eye; OU both eyes  XT exotropia; ET esotropia; PEK punctate epithelial keratitis; PEE punctate epithelial erosions; DES dry eye syndrome; MGD meibomian gland dysfunction; ATs artificial tears; PFAT's preservative free artificial tears; NSC nuclear sclerotic cataract; PSC posterior subcapsular cataract; ERM epi-retinal membrane; PVD posterior vitreous  detachment; RD retinal detachment; DM diabetes mellitus; DR diabetic  retinopathy; NPDR non-proliferative diabetic retinopathy; PDR proliferative diabetic retinopathy; CSME clinically significant macular edema; DME diabetic macular edema; dbh dot blot hemorrhages; CWS cotton wool spot; POAG primary open angle glaucoma; C/D cup-to-disc ratio; HVF humphrey visual field; GVF goldmann visual field; OCT optical coherence tomography; IOP intraocular pressure; BRVO Branch retinal vein occlusion; CRVO central retinal vein occlusion; CRAO central retinal artery occlusion; BRAO branch retinal artery occlusion; RT retinal tear; SB scleral buckle; PPV pars plana vitrectomy; VH Vitreous hemorrhage; PRP panretinal laser photocoagulation; IVK intravitreal kenalog ; VMT vitreomacular traction; MH Macular hole;  NVD neovascularization of the disc; NVE neovascularization elsewhere; AREDS age related eye disease study; ARMD age related macular degeneration; POAG primary open angle glaucoma; EBMD epithelial/anterior basement membrane dystrophy; ACIOL anterior chamber intraocular lens; IOL intraocular lens; PCIOL posterior chamber intraocular lens; Phaco/IOL phacoemulsification with intraocular lens placement; PRK photorefractive keratectomy; LASIK laser assisted in situ keratomileusis; HTN hypertension; DM diabetes mellitus; COPD chronic obstructive pulmonary disease

## 2023-07-01 LAB — DERMATOLOGY PATHOLOGY

## 2023-07-10 ENCOUNTER — Ambulatory Visit (INDEPENDENT_AMBULATORY_CARE_PROVIDER_SITE_OTHER): Payer: Medicare Other | Admitting: Ophthalmology

## 2023-07-10 ENCOUNTER — Encounter (INDEPENDENT_AMBULATORY_CARE_PROVIDER_SITE_OTHER): Payer: Self-pay | Admitting: Ophthalmology

## 2023-07-10 DIAGNOSIS — H33323 Round hole, bilateral: Secondary | ICD-10-CM

## 2023-07-10 DIAGNOSIS — H35413 Lattice degeneration of retina, bilateral: Secondary | ICD-10-CM | POA: Diagnosis not present

## 2023-07-10 DIAGNOSIS — H25813 Combined forms of age-related cataract, bilateral: Secondary | ICD-10-CM | POA: Diagnosis not present

## 2023-09-24 NOTE — Progress Notes (Signed)
 [facility] - Clinic Note  10/08/2023   CHIEF COMPLAINT Patient presents for Retina Follow Up  HISTORY OF PRESENT ILLNESS: Toni Rodriguez is a 68 y.o. female who presents to the clinic today for:  HPI     Retina Follow Up   Patient presents with  Other.  In both eyes.  This started 6 months ago.  Duration of 3 months.  Since onset it is stable.  I, the attending physician,  performed the HPI with the patient and updated documentation appropriately.        Comments   Patient presents for 3 month retina follow up, lattice degeneration OU. Patient states vision has been about the same. Pt is having severe itching in her lower lids. Pt denies FOL/floaters. Pt has been more consistent with Latanoprost QHS OU.       Last edited by Ronelle Coffee, MD on 10/08/2023 10:55 PM.     Pt states she is seeing floaters   Referring physician: [facility], [facility] [address] [place],  [place] [address]  HISTORICAL INFORMATION:  Selected notes from the MEDICAL RECORD NUMBER Referred by Dr. Micael Adas for concern of lattice with retinal hole OS LEE:  Ocular Hx- PMH-   CURRENT MEDICATIONS: Current Outpatient Medications (Ophthalmic Drugs)  Medication Sig   latanoprost (XALATAN) 0.005 % ophthalmic solution 1 drop at bedtime.   No current facility-administered medications for this visit. (Ophthalmic Drugs)   Current Outpatient Medications (Other)  Medication Sig   fluticasone (FLONASE) 50 MCG/ACT nasal spray Place 1 spray into both nostrils 2 (two) times daily.   ibuprofen  (ADVIL ) 800 MG tablet Take 1 tablet (800 mg total) by mouth every 8 (eight) hours as needed for moderate pain.   Liniments (SALONPAS PAIN RELIEF PATCH EX) Apply 1 patch topically daily as needed (for pain).   loratadine (CLARITIN) 10 MG tablet Take 10 mg by mouth daily as needed.   oxybutynin  (DITROPAN ) 5 MG tablet Take 1 tablet (5 mg total) by mouth 3 (three) times daily.    Cyanocobalamin (VITAMIN B-12 PO) Take 1 tablet by mouth daily.   loperamide  (IMODIUM ) 2 MG capsule Take 2 capsules (4 mg total) by mouth 2 (two) times daily as needed for diarrhea or loose stools.   ondansetron  (ZOFRAN -ODT) 8 MG disintegrating tablet Take 1 tablet (8 mg total) by mouth every 8 (eight) hours as needed for nausea or vomiting.   tiZANidine  (ZANAFLEX ) 4 MG capsule Take 1 capsule (4 mg total) by mouth 3 (three) times daily.   No current facility-administered medications for this visit. (Other)   REVIEW OF SYSTEMS: ROS   Positive for: Eyes, Allergic/Imm Negative for: Constitutional, Gastrointestinal, Neurological, Skin, Genitourinary, Musculoskeletal, HENT, Endocrine, Cardiovascular, Respiratory, Psychiatric, Heme/Lymph Last edited by Carrington Clack, COT on 10/08/2023 10:09 AM.      ALLERGIES Allergies  Allergen Reactions   Penicillins Hives and Other (See Comments)    Has patient had a PCN reaction causing immediate rash, facial/tongue/throat swelling, SOB or lightheadedness with hypotension: Yes Has patient had a PCN reaction causing severe rash involving mucus membranes or skin necrosis: No Has patient had a PCN reaction that required hospitalization: No Has patient had a PCN reaction occurring within the last 10 years: No If all of the above answers are "NO", then may proceed with Cephalosporin use.    PAST MEDICAL HISTORY Past Medical History:  Diagnosis Date   UTI (lower urinary tract infection)    Past Surgical History:  Procedure Laterality Date   ABDOMINAL HYSTERECTOMY     BREAST BIOPSY Left 10/2015   BREAST BIOPSY Right 10/2015   benign   BREAST BIOPSY Left 06/17/2023   MM LT BREAST BX W LOC DEV 1ST LESION IMAGE BX SPEC STEREO GUIDE 06/17/2023 [facility]   COLONOSCOPY WITH PROPOFOL  N/A 06/03/2017   Procedure: COLONOSCOPY WITH PROPOFOL ;  Surgeon: Genell Ken, MD;  Location: [facility];  Service: Gastroenterology;  Laterality: N/A;   FLEXIBLE  SIGMOIDOSCOPY N/A 06/25/2017   Procedure: FLEXIBLE SIGMOIDOSCOPY;  Surgeon: Genell Ken, MD;  Location: [facility];  Service: Gastroenterology;  Laterality: N/A;   TONSILLECTOMY     FAMILY HISTORY Family History  Problem Relation Age of Onset   Hypertension Mother    Cataracts Mother    Diabetes Father    Hypertension Father    Diabetes Maternal Grandmother    Diabetes Maternal Grandfather    Hyperlipidemia Maternal Grandfather    Diabetes Paternal Grandmother    Diabetes Paternal Grandfather    SOCIAL HISTORY Social History   Tobacco Use   Smoking status: Never   Smokeless tobacco: Never  Vaping Use   Vaping status: Never Used  Substance Use Topics   Alcohol use: No   Drug use: No       OPHTHALMIC EXAM:  Base Eye Exam     Visual Acuity (Snellen - Linear)       Right Left   Dist cc 20/20 -1 20/20 -1         Tonometry (Tonopen, 10:05 AM)       Right Left   Pressure 20 21         Pupils       Pupils Dark Light Shape React APD   Right PERRL 3 2 Round Brisk None   Left PERRL 3 2 Round Brisk None         Visual Fields       Left Right    Full Full         Extraocular Movement       Right Left    Full, Ortho Full, Ortho         Neuro/Psych     Oriented x3: Yes   Mood/Affect: Normal         Dilation     Both eyes: 1.0% Mydriacyl, 2.5% Phenylephrine @ 10:06 AM           Slit Lamp and Fundus Exam     Slit Lamp Exam       Right Left   Lids/Lashes Normal Normal   Conjunctiva/Sclera mild melanosis, nasal and temporal pinguecula mild melanosis, nasal and temporal pinguecula   Cornea tear film debris, trace PEE tear film debris, trace PEE   Anterior Chamber deep and clear deep and clear   Iris Round and dilated Round and dilated   Lens 2+ Nuclear sclerosis, 2-3+ Cortical cataract 2+ Nuclear sclerosis, 2-3+ Cortical cataract   Anterior Vitreous mild syneresis mild syneresis, Posterior vitreous detachment, vitreous condensations          Fundus Exam       Right Left   Disc Pink and Sharp, PPA Pink and Sharp, PPP   C/D Ratio 0.3 0.5   Macula Flat, Good foveal reflex, No heme or edema Flat, Good foveal reflex, RPE mottling, No heme or edema   Vessels attenuated, mild tortuosity attenuated, mild tortuosity   Periphery Attached, patches of lattice with atrophic holes from 1030 to 0100; pigmented lattice at 0730 and  pigmented tuft at 1000 -- good laser changes surrounding all lesions, no new RT/RD or lattice Attached, patches of pigmented lattice at 1200, 0130, 0300, 0400 and 0600 -- good laser changes surrounding all lesions, no new RT/RD or lattice           Refraction     Wearing Rx       Sphere Cylinder Axis Add   Right -2.00 +0.50 134 +0.00   Left -3.00 +0.25 121 +0.00           IMAGING AND PROCEDURES  Imaging and Procedures for 10/08/2023  OCT, Retina - OU - Both Eyes       Right Eye Quality was good. Central Foveal Thickness: 234. Progression has been stable. Findings include normal foveal contour, no IRF, no SRF, vitreomacular adhesion .   Left Eye Quality was good. Central Foveal Thickness: 236. Progression has been stable. Findings include normal foveal contour, no IRF, no SRF, vitreomacular adhesion .   Notes *Images captured and stored on drive  Diagnosis / Impression:  NFP, no IRF/SRF OU  Clinical management:  See below  Abbreviations: NFP - Normal foveal profile. CME - cystoid macular edema. PED - pigment epithelial detachment. IRF - intraretinal fluid. SRF - subretinal fluid. EZ - ellipsoid zone. ERM - epiretinal membrane. ORA - outer retinal atrophy. ORT - outer retinal tubulation. SRHM - subretinal hyper-reflective material. IRHM - intraretinal hyper-reflective material           ASSESSMENT/PLAN:   ICD-10-CM   1. Bilateral retinal lattice degeneration  H35.413 OCT, Retina - OU - Both Eyes    2. Retinal hole of both eyes  H33.323     3. Combined forms of age-related  cataract of both eyes  H25.813      1,2. Lattice degeneration w/ atrophic holes, both eyes - OD: patches of lattice with atrophic holes from 1030 to 0100 oclock; pigmented lattice at 0730 and pigmented tuft at 1000 - OS: patches of pigmented lattice spanning 1200 to 0600 temporal periphery - s/p laser retinopexy OS (12.19.24) -- good laser changes surrounding all lesions - s/p laser retinopexy OD (12.15.25) -- good laser changes surrounding all lesions - no new RT/RD or lattice - f/u 1 yr, DFE, OCT  3. Mixed Cataract OU - The symptoms of cataract, surgical options, and treatments and risks were discussed with patient. - discussed diagnosis and progression - monitor  Ophthalmic Meds Ordered this visit:  No orders of the defined types were placed in this encounter.    Return in about 1 year (around 10/07/2024) for f/u lattice degeneration OU, DFE, OCT.  There are no Patient Instructions on file for this visit.  Explained the diagnoses, plan, and follow up with the patient and they expressed understanding.  Patient expressed understanding of the importance of proper follow up care.   This document serves as a record of services personally performed by Jeanice Millard, MD, PhD. It was created on their behalf by Olene Berne, COT an ophthalmic technician. The creation of this record is the provider's dictation and/or activities during the visit.    Electronically signed by:  Olene Berne, COT  10/08/23 10:56 PM  This document serves as a record of services personally performed by Jeanice Millard, MD, PhD. It was created on their behalf by Morley Arabia. Bevin Bucks, OA an ophthalmic technician. The creation of this record is the provider's dictation and/or activities during the visit.    Electronically signed by: Morley Arabia. Bevin Bucks,  OA 10/08/23 10:56 PM  Jeanice Millard, M.D., Ph.D. Diseases & Surgery of the Retina and Vitreous [facility] 10/08/2023  I have  reviewed the above documentation for accuracy and completeness, and I agree with the above. Jeanice Millard, M.D., Ph.D. 10/08/23 10:57 PM   Abbreviations: M myopia (nearsighted); A astigmatism; H hyperopia (farsighted); P presbyopia; Mrx spectacle prescription;  CTL contact lenses; OD right eye; OS left eye; OU both eyes  XT exotropia; ET esotropia; PEK punctate epithelial keratitis; PEE punctate epithelial erosions; DES dry eye syndrome; MGD meibomian gland dysfunction; ATs artificial tears; PFAT's preservative free artificial tears; NSC nuclear sclerotic cataract; PSC posterior subcapsular cataract; ERM epi-retinal membrane; PVD posterior vitreous detachment; RD retinal detachment; DM diabetes mellitus; DR diabetic retinopathy; NPDR non-proliferative diabetic retinopathy; PDR proliferative diabetic retinopathy; CSME clinically significant macular edema; DME diabetic macular edema; dbh dot blot hemorrhages; CWS cotton wool spot; POAG primary open angle glaucoma; C/D cup-to-disc ratio; HVF humphrey visual field; GVF goldmann visual field; OCT optical coherence tomography; IOP intraocular pressure; BRVO Branch retinal vein occlusion; CRVO central retinal vein occlusion; CRAO central retinal artery occlusion; BRAO branch retinal artery occlusion; RT retinal tear; SB scleral buckle; PPV pars plana vitrectomy; VH Vitreous hemorrhage; PRP panretinal laser photocoagulation; IVK intravitreal kenalog ; VMT vitreomacular traction; MH Macular hole;  NVD neovascularization of the disc; NVE neovascularization elsewhere; AREDS age related eye disease study; ARMD age related macular degeneration; POAG primary open angle glaucoma; EBMD epithelial/anterior basement membrane dystrophy; ACIOL anterior chamber intraocular lens; IOL intraocular lens; PCIOL posterior chamber intraocular lens; Phaco/IOL phacoemulsification with intraocular lens placement; PRK photorefractive keratectomy; LASIK laser assisted in situ keratomileusis;  HTN hypertension; DM diabetes mellitus; COPD chronic obstructive pulmonary disease

## 2023-10-08 ENCOUNTER — Encounter (INDEPENDENT_AMBULATORY_CARE_PROVIDER_SITE_OTHER): Payer: Self-pay | Admitting: Ophthalmology

## 2023-10-08 ENCOUNTER — Ambulatory Visit (INDEPENDENT_AMBULATORY_CARE_PROVIDER_SITE_OTHER): Payer: Medicare Other | Admitting: Ophthalmology

## 2023-10-08 DIAGNOSIS — H35413 Lattice degeneration of retina, bilateral: Secondary | ICD-10-CM

## 2023-10-08 DIAGNOSIS — H33323 Round hole, bilateral: Secondary | ICD-10-CM | POA: Diagnosis not present

## 2023-10-08 DIAGNOSIS — H25813 Combined forms of age-related cataract, bilateral: Secondary | ICD-10-CM

## 2024-04-21 ENCOUNTER — Other Ambulatory Visit: Payer: Self-pay | Admitting: Nurse Practitioner

## 2024-04-21 DIAGNOSIS — Z1231 Encounter for screening mammogram for malignant neoplasm of breast: Secondary | ICD-10-CM

## 2024-04-27 ENCOUNTER — Ambulatory Visit (INDEPENDENT_AMBULATORY_CARE_PROVIDER_SITE_OTHER)

## 2024-04-27 ENCOUNTER — Ambulatory Visit (HOSPITAL_COMMUNITY)
Admission: EM | Admit: 2024-04-27 | Discharge: 2024-04-27 | Disposition: A | Discharge status: Home / Self Care | Attending: Emergency Medicine | Admitting: Emergency Medicine

## 2024-04-27 ENCOUNTER — Encounter (HOSPITAL_COMMUNITY): Payer: Self-pay | Admitting: *Deleted

## 2024-04-27 ENCOUNTER — Ambulatory Visit (HOSPITAL_COMMUNITY): Payer: Self-pay | Admitting: Emergency Medicine

## 2024-04-27 DIAGNOSIS — R52 Pain, unspecified: Secondary | ICD-10-CM

## 2024-04-27 DIAGNOSIS — R0789 Other chest pain: Secondary | ICD-10-CM | POA: Diagnosis not present

## 2024-04-27 DIAGNOSIS — M549 Dorsalgia, unspecified: Secondary | ICD-10-CM | POA: Diagnosis not present

## 2024-04-27 MED ORDER — CYCLOBENZAPRINE HCL 5 MG PO TABS: 5.0000 mg | ORAL_TABLET | Freq: Every evening | ORAL | 0 refills | Status: DC | PRN

## 2024-04-27 MED ORDER — KETOROLAC TROMETHAMINE 30 MG/ML IJ SOLN
INTRAMUSCULAR | Status: AC
  Filled 2024-04-27: qty 1

## 2024-04-27 MED ORDER — KETOROLAC TROMETHAMINE 30 MG/ML IJ SOLN
30.0000 mg | Freq: Once | INTRAMUSCULAR | Status: AC
  Administered 2024-04-27: 30 mg via INTRAMUSCULAR
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
## 2024-04-27 NOTE — Discharge Instructions (Addendum)
 I will call you if there is anything abnormal on your xray. On my initial view I do not see anything broken.   Toradol  injection was given today Please do not use any NSAIDs (ibuprofen /Advil , naproxen /Aleve , etc) for the next 24 hours. You can safely use tylenol.   Flexeril  (muscle relaxer) 1 tablet at bedtime. This might make you drowsy. You can increase to 2 tablets together if needed.  I recommend heating pad and hot bath or shower Gentle stretching and exercise Allow up to a week for symptoms to improve Call your primary care provider for a follow up visit Please go to the emergency department if symptoms worsen -- especially with any severe pain
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
## 2024-05-06 ENCOUNTER — Encounter (HOSPITAL_COMMUNITY): Payer: Self-pay

## 2024-05-06 ENCOUNTER — Ambulatory Visit (HOSPITAL_COMMUNITY)
Admission: EM | Admit: 2024-05-06 | Discharge: 2024-05-06 | Disposition: A | Discharge status: Home / Self Care | Attending: Family Medicine | Admitting: Family Medicine

## 2024-05-06 DIAGNOSIS — M25512 Pain in left shoulder: Secondary | ICD-10-CM | POA: Diagnosis not present

## 2024-05-06 MED ORDER — DEXAMETHASONE SOD PHOSPHATE PF 10 MG/ML IJ SOLN
10.0000 mg | Freq: Once | INTRAMUSCULAR | Status: AC
  Administered 2024-05-06: 10 mg via INTRAMUSCULAR

## 2024-05-06 MED ORDER — CYCLOBENZAPRINE HCL 5 MG PO TABS: 5.0000 mg | ORAL_TABLET | Freq: Every evening | ORAL | 0 refills | Status: AC | PRN

## 2024-05-06 NOTE — Discharge Instructions (Signed)
 Meds ordered this encounter  Medications   cyclobenzaprine  (FLEXERIL ) 5 MG tablet    Sig: Take 1 tablet (5 mg total) by mouth at bedtime as needed for muscle spasms. Can increase to 2 tablets (10 mg total) if needed    Dispense:  30 tablet    Refill:  0   dexamethasone (DECADRON) injection 10 mg
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
## 2024-05-13 ENCOUNTER — Ambulatory Visit: Payer: Self-pay | Admitting: Family Medicine

## 2024-05-13 VITALS — BP 122/70 | Ht 69.0 in | Wt 213.0 lb

## 2024-05-13 DIAGNOSIS — M25552 Pain in left hip: Secondary | ICD-10-CM | POA: Diagnosis not present

## 2024-05-13 DIAGNOSIS — S39012A Strain of muscle, fascia and tendon of lower back, initial encounter: Secondary | ICD-10-CM | POA: Diagnosis not present

## 2024-05-13 DIAGNOSIS — M542 Cervicalgia: Secondary | ICD-10-CM | POA: Diagnosis not present

## 2024-05-13 MED ORDER — PREDNISONE 10 MG PO TABS: ORAL_TABLET | ORAL | 0 refills | Status: AC
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
## 2024-05-13 NOTE — Patient Instructions (Signed)
 Get x-rays after you leave today of your left hip, cervical spine, and lumbar spine. We will call you with results. Start prednisone  dose pack as directed. Don't take aleve  or ibuprofen  while on this. Ok to take tylenol with it though. Take your muscle relaxant as needed - call me if you need more of this in the future. Heat 15 minutes at a time as needed. Let me know how you're doing in 1 week (mychart message or call).

## 2024-05-14 ENCOUNTER — Ambulatory Visit
Admission: RE | Admit: 2024-05-14 | Discharge: 2024-05-14 | Disposition: A | Discharge status: Home / Self Care | Source: Ambulatory Visit | Attending: Family Medicine | Admitting: Family Medicine

## 2024-05-14 ENCOUNTER — Encounter: Payer: Self-pay | Admitting: Family Medicine

## 2024-05-14 DIAGNOSIS — M542 Cervicalgia: Secondary | ICD-10-CM

## 2024-05-14 DIAGNOSIS — M25552 Pain in left hip: Secondary | ICD-10-CM

## 2024-05-14 DIAGNOSIS — S39012A Strain of muscle, fascia and tendon of lower back, initial encounter: Secondary | ICD-10-CM

## 2024-05-18 ENCOUNTER — Ambulatory Visit: Admitting: Family Medicine

## 2024-05-18 VITALS — BP 136/78 | Ht 69.0 in | Wt 212.0 lb

## 2024-05-18 DIAGNOSIS — M25552 Pain in left hip: Secondary | ICD-10-CM

## 2024-05-18 MED ORDER — KETOROLAC TROMETHAMINE 60 MG/2ML IM SOLN
60.0000 mg | Freq: Once | INTRAMUSCULAR | Status: AC
  Administered 2024-05-18: 16:00:00 60 mg via INTRAMUSCULAR

## 2024-05-18 NOTE — Patient Instructions (Signed)
 We will go ahead with an MRI of your left hip given the amount of discomfort you're having on this side, difficulty walking.  It's still likely a hip contusion. Go ahead with the physical therapy, start taking the prednisone  dose pack as directed. Continue the flexeril  at night as you have been. You were given an IM injection of toradol  today to help with pain also. Set up a visit after your MRI results come back (it can be a virtual visit) to go over results and next steps).

## 2024-05-19 ENCOUNTER — Encounter: Payer: Self-pay | Admitting: Family Medicine

## 2024-05-19 ENCOUNTER — Ambulatory Visit: Payer: Self-pay | Admitting: Family Medicine

## 2024-05-19 NOTE — Progress Notes (Signed)
 PCP: [facility]  Patient is a 68 y.o. female here for left leg pain.  HPI Patient has not yet started her oral prednisone  pack - was concerned about recent Lasik surgery and if this would be contraindicated. Pain has continued especially in her left leg. Difficulty bearing weight on the left leg with pain anterior and lateral left hip. Associated numbness. Pain in low back is more of a catching on the right side. Has tingling in both hands in 1st-3rd digits. No bowel/bladder dysfunction.  Past Medical History:  Diagnosis Date   UTI (lower urinary tract infection)     Medications Ordered Prior to Encounter[1]  Past Surgical History:  Procedure Laterality Date   ABDOMINAL HYSTERECTOMY     BREAST BIOPSY Left 10/2015   BREAST BIOPSY Right 10/2015   benign   BREAST BIOPSY Left 06/17/2023   MM LT BREAST BX W LOC DEV 1ST LESION IMAGE BX SPEC STEREO GUIDE 06/17/2023 [facility]   COLONOSCOPY WITH PROPOFOL  N/A 06/03/2017   Procedure: COLONOSCOPY WITH PROPOFOL ;  Surgeon: Saintclair Jasper, MD;  Location: [facility];  Service: Gastroenterology;  Laterality: N/A;   FLEXIBLE SIGMOIDOSCOPY N/A 06/25/2017   Procedure: FLEXIBLE SIGMOIDOSCOPY;  Surgeon: Saintclair Jasper, MD;  Location: [facility];  Service: Gastroenterology;  Laterality: N/A;   TONSILLECTOMY      Allergies[2]  BP 136/78   Ht 5' 9 (1.753 m)   Wt 212 lb (96.2 kg)   BMI 31.31 kg/m       No data to display              No data to display              Objective:  Physical Exam:  Gen: NAD, comfortable in exam room  Back/left leg: No gross deformity, scoliosis. TTP greater trochanter, gluteal musculature.  No midline or bony TTP of lumbar spine. FROM left hip.  Back motion limited due to pain. Strength LEs 5/5 all muscle groups.   Negative SLRs. Sensation intact to light touch bilaterally.  Assessment and Plan:  Left hip/leg pain - concern with difficulty bearing weight.   Still suspect contusion as most likely diagnosis but with this issue will likely go ahead with MRI of left hip if radiographs negative. Cervical and lumbar strains - start physical therapy.  Awaiting radiograph results.  IM toradol  given today.  Encouraged to take prednisone  dose pack.  Flexeril  at bedtime as needed.    [1]  Current Outpatient Medications on File Prior to Visit  Medication Sig Dispense Refill   cyclobenzaprine  (FLEXERIL ) 5 MG tablet Take 1 tablet (5 mg total) by mouth at bedtime as needed for muscle spasms. Can increase to 2 tablets (10 mg total) if needed 30 tablet 0   fluticasone (FLONASE) 50 MCG/ACT nasal spray Place 1 spray into both nostrils 2 (two) times daily.     ibuprofen  (ADVIL ) 800 MG tablet Take 1 tablet (800 mg total) by mouth every 8 (eight) hours as needed for moderate pain. 30 tablet 0   latanoprost (XALATAN) 0.005 % ophthalmic solution 1 drop at bedtime.     Liniments (SALONPAS PAIN RELIEF PATCH EX) Apply 1 patch topically daily as needed (for pain).     loratadine (CLARITIN) 10 MG tablet Take 10 mg by mouth daily as needed.     oxybutynin  (DITROPAN ) 5 MG tablet Take 1 tablet (5 mg total) by mouth 3 (three) times daily. 90 tablet 0   predniSONE  (DELTASONE ) 10 MG tablet 6  tabs po day 1, 5 tabs po day 2, 4 tabs po day 3, 3 tabs po day 4, 2 tabs po day 5, 1 tab po day 6 21 tablet 0   No current facility-administered medications on file prior to visit.  [2]  Allergies Allergen Reactions   Penicillins Hives and Other (See Comments)    Has patient had a PCN reaction causing immediate rash, facial/tongue/throat swelling, SOB or lightheadedness with hypotension: Yes Has patient had a PCN reaction causing severe rash involving mucus membranes or skin necrosis: No Has patient had a PCN reaction that required hospitalization: No Has patient had a PCN reaction occurring within the last 10 years: No If all of the above answers are NO, then may proceed with  Cephalosporin use.

## 2024-05-22 ENCOUNTER — Encounter: Payer: Self-pay | Admitting: Physical Therapy

## 2024-05-22 ENCOUNTER — Ambulatory Visit: Attending: Family Medicine | Admitting: Physical Therapy

## 2024-05-22 DIAGNOSIS — M5459 Other low back pain: Secondary | ICD-10-CM | POA: Insufficient documentation

## 2024-05-22 DIAGNOSIS — M5416 Radiculopathy, lumbar region: Secondary | ICD-10-CM | POA: Diagnosis present

## 2024-05-22 DIAGNOSIS — M5412 Radiculopathy, cervical region: Secondary | ICD-10-CM | POA: Diagnosis present

## 2024-05-22 DIAGNOSIS — S39012A Strain of muscle, fascia and tendon of lower back, initial encounter: Secondary | ICD-10-CM | POA: Insufficient documentation

## 2024-05-22 DIAGNOSIS — M542 Cervicalgia: Secondary | ICD-10-CM | POA: Diagnosis present
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
## 2024-05-25 ENCOUNTER — Other Ambulatory Visit: Payer: Self-pay

## 2024-05-25 ENCOUNTER — Ambulatory Visit
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
## 2024-05-26 ENCOUNTER — Ambulatory Visit: Admitting: Physical Therapy

## 2024-05-26 ENCOUNTER — Telehealth: Payer: Self-pay

## 2024-05-26 NOTE — Telephone Encounter (Signed)
 TC on 05/25/24 due to missed visit.  Spoke with patient who requested a call back later but clinician unable to comply with later call back

## 2024-05-29 ENCOUNTER — Ambulatory Visit

## 2024-05-29 DIAGNOSIS — M542 Cervicalgia: Secondary | ICD-10-CM

## 2024-05-29 DIAGNOSIS — M5412 Radiculopathy, cervical region: Secondary | ICD-10-CM

## 2024-05-29 DIAGNOSIS — M5459 Other low back pain: Secondary | ICD-10-CM

## 2024-05-29 DIAGNOSIS — M5416 Radiculopathy, lumbar region: Secondary | ICD-10-CM
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
## 2024-06-01 ENCOUNTER — Ambulatory Visit
Admission: RE | Admit: 2024-06-01 | Discharge: 2024-06-01 | Disposition: A | Source: Ambulatory Visit | Attending: Nurse Practitioner | Admitting: Nurse Practitioner

## 2024-06-01 DIAGNOSIS — Z1231 Encounter for screening mammogram for malignant neoplasm of breast: Secondary | ICD-10-CM
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
## 2024-06-03 ENCOUNTER — Ambulatory Visit

## 2024-06-03 DIAGNOSIS — M542 Cervicalgia: Secondary | ICD-10-CM

## 2024-06-03 DIAGNOSIS — M5412 Radiculopathy, cervical region: Secondary | ICD-10-CM

## 2024-06-03 DIAGNOSIS — M5459 Other low back pain: Secondary | ICD-10-CM

## 2024-06-03 DIAGNOSIS — M5416 Radiculopathy, lumbar region: Secondary | ICD-10-CM

## 2024-06-05 ENCOUNTER — Ambulatory Visit: Attending: Family Medicine

## 2024-06-05 DIAGNOSIS — M542 Cervicalgia: Secondary | ICD-10-CM | POA: Diagnosis present

## 2024-06-05 DIAGNOSIS — M5459 Other low back pain: Secondary | ICD-10-CM | POA: Insufficient documentation

## 2024-06-05 DIAGNOSIS — M5412 Radiculopathy, cervical region: Secondary | ICD-10-CM | POA: Insufficient documentation

## 2024-06-05 DIAGNOSIS — M5416 Radiculopathy, lumbar region: Secondary | ICD-10-CM | POA: Insufficient documentation

## 2024-06-08 ENCOUNTER — Ambulatory Visit
Admission: RE | Admit: 2024-06-08 | Discharge: 2024-06-08 | Disposition: A | Discharge status: Home / Self Care | Source: Ambulatory Visit | Attending: Family Medicine | Admitting: Family Medicine

## 2024-06-08 DIAGNOSIS — M25552 Pain in left hip: Secondary | ICD-10-CM
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
## 2024-06-10 ENCOUNTER — Ambulatory Visit

## 2024-06-10 DIAGNOSIS — M5412 Radiculopathy, cervical region: Secondary | ICD-10-CM

## 2024-06-10 DIAGNOSIS — M542 Cervicalgia: Secondary | ICD-10-CM

## 2024-06-10 DIAGNOSIS — M5416 Radiculopathy, lumbar region: Secondary | ICD-10-CM

## 2024-06-10 DIAGNOSIS — M5459 Other low back pain: Secondary | ICD-10-CM | POA: Diagnosis not present

## 2024-06-12 ENCOUNTER — Ambulatory Visit

## 2024-06-15 ENCOUNTER — Ambulatory Visit

## 2024-06-16 ENCOUNTER — Ambulatory Visit

## 2024-06-16 DIAGNOSIS — M5416 Radiculopathy, lumbar region: Secondary | ICD-10-CM

## 2024-06-16 DIAGNOSIS — M5412 Radiculopathy, cervical region: Secondary | ICD-10-CM

## 2024-06-16 DIAGNOSIS — M542 Cervicalgia: Secondary | ICD-10-CM

## 2024-06-16 DIAGNOSIS — M5459 Other low back pain: Secondary | ICD-10-CM | POA: Diagnosis not present
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
## 2024-06-18 ENCOUNTER — Ambulatory Visit

## 2024-06-18 DIAGNOSIS — M542 Cervicalgia: Secondary | ICD-10-CM

## 2024-06-18 DIAGNOSIS — M5459 Other low back pain: Secondary | ICD-10-CM

## 2024-06-18 DIAGNOSIS — M5412 Radiculopathy, cervical region: Secondary | ICD-10-CM

## 2024-06-18 DIAGNOSIS — M5416 Radiculopathy, lumbar region: Secondary | ICD-10-CM
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
## 2024-06-19 ENCOUNTER — Ambulatory Visit: Payer: Self-pay | Admitting: Family Medicine

## 2024-06-24 ENCOUNTER — Ambulatory Visit: Admitting: Physical Therapy

## 2024-06-24 ENCOUNTER — Encounter: Payer: Self-pay | Admitting: Physical Therapy

## 2024-06-24 DIAGNOSIS — M5459 Other low back pain: Secondary | ICD-10-CM

## 2024-06-24 DIAGNOSIS — M5416 Radiculopathy, lumbar region: Secondary | ICD-10-CM

## 2024-06-24 DIAGNOSIS — M5412 Radiculopathy, cervical region: Secondary | ICD-10-CM

## 2024-06-24 DIAGNOSIS — M542 Cervicalgia: Secondary | ICD-10-CM
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
## 2024-06-26 ENCOUNTER — Encounter: Payer: Self-pay | Admitting: Physical Therapy

## 2024-06-26 ENCOUNTER — Ambulatory Visit: Admitting: Physical Therapy

## 2024-06-26 DIAGNOSIS — M5416 Radiculopathy, lumbar region: Secondary | ICD-10-CM

## 2024-06-26 DIAGNOSIS — M5459 Other low back pain: Secondary | ICD-10-CM

## 2024-06-26 DIAGNOSIS — M5412 Radiculopathy, cervical region: Secondary | ICD-10-CM

## 2024-06-26 DIAGNOSIS — M542 Cervicalgia: Secondary | ICD-10-CM

## 2024-06-29 ENCOUNTER — Ambulatory Visit
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
## 2024-07-02 ENCOUNTER — Encounter: Payer: Self-pay | Admitting: Physical Therapy

## 2024-07-02 ENCOUNTER — Ambulatory Visit: Admitting: Physical Therapy

## 2024-07-02 DIAGNOSIS — M5459 Other low back pain: Secondary | ICD-10-CM

## 2024-07-02 DIAGNOSIS — M5412 Radiculopathy, cervical region: Secondary | ICD-10-CM

## 2024-07-02 DIAGNOSIS — M542 Cervicalgia: Secondary | ICD-10-CM

## 2024-07-02 DIAGNOSIS — M5416 Radiculopathy, lumbar region: Secondary | ICD-10-CM

## 2024-07-06 ENCOUNTER — Ambulatory Visit

## 2024-07-09 ENCOUNTER — Ambulatory Visit

## 2024-07-09 DIAGNOSIS — M5416 Radiculopathy, lumbar region: Secondary | ICD-10-CM

## 2024-07-09 DIAGNOSIS — M5459 Other low back pain: Secondary | ICD-10-CM

## 2024-07-09 DIAGNOSIS — M5412 Radiculopathy, cervical region: Secondary | ICD-10-CM

## 2024-07-09 DIAGNOSIS — M542 Cervicalgia: Secondary | ICD-10-CM
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
## 2024-07-13 ENCOUNTER — Ambulatory Visit

## 2024-07-17 ENCOUNTER — Ambulatory Visit

## 2024-07-22 ENCOUNTER — Ambulatory Visit

## 2024-07-24 ENCOUNTER — Ambulatory Visit

## 2024-07-28 ENCOUNTER — Ambulatory Visit

## 2024-07-30 ENCOUNTER — Ambulatory Visit

## 2024-10-07 ENCOUNTER — Encounter (INDEPENDENT_AMBULATORY_CARE_PROVIDER_SITE_OTHER): Admitting: Ophthalmology
